# Patient Record
Sex: Female | Born: 1970 | Hispanic: Yes | Marital: Married | Smoking: Never smoker
Health system: Southern US, Community
[De-identification: ages and names within clinical notes are randomized; demographics above are authoritative.]

## PROBLEM LIST (undated history)

## (undated) DIAGNOSIS — H269 Unspecified cataract: Secondary | ICD-10-CM

## (undated) DIAGNOSIS — N920 Excessive and frequent menstruation with regular cycle: Secondary | ICD-10-CM

## (undated) DIAGNOSIS — E538 Deficiency of other specified B group vitamins: Secondary | ICD-10-CM

## (undated) DIAGNOSIS — T7840XA Allergy, unspecified, initial encounter: Secondary | ICD-10-CM

## (undated) DIAGNOSIS — J302 Other seasonal allergic rhinitis: Secondary | ICD-10-CM

## (undated) DIAGNOSIS — R519 Headache, unspecified: Secondary | ICD-10-CM

## (undated) DIAGNOSIS — D509 Iron deficiency anemia, unspecified: Secondary | ICD-10-CM

## (undated) DIAGNOSIS — R569 Unspecified convulsions: Secondary | ICD-10-CM

## (undated) DIAGNOSIS — Z5189 Encounter for other specified aftercare: Secondary | ICD-10-CM

## (undated) HISTORY — PX: BREAST BIOPSY: SHX20

## (undated) HISTORY — DX: Unspecified convulsions: R56.9

## (undated) HISTORY — DX: Unspecified cataract: H26.9

## (undated) HISTORY — PX: BREAST SURGERY: SHX581

## (undated) HISTORY — DX: Other seasonal allergic rhinitis: J30.2

## (undated) HISTORY — PX: APPENDECTOMY: SHX54

## (undated) HISTORY — DX: Allergy, unspecified, initial encounter: T78.40XA

## (undated) HISTORY — DX: Iron deficiency anemia, unspecified: D50.9

## (undated) HISTORY — PX: CATARACT EXTRACTION, BILATERAL: SHX1313

## (undated) HISTORY — PX: WISDOM TOOTH EXTRACTION: SHX21

## (undated) HISTORY — PX: CYST EXCISION: SHX5701

---
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
## 1996-05-21 HISTORY — PX: TUBAL LIGATION: SHX77

## 2016-04-27 LAB — GLUCOSE, POCT (MANUAL RESULT ENTRY): POC GLUCOSE: 88 mg/dL (ref 70–99)

## 2017-11-18 ENCOUNTER — Emergency Department (HOSPITAL_COMMUNITY)
Admission: EM | Admit: 2017-11-18 | Discharge: 2017-11-18 | Disposition: A | Discharge status: Home / Self Care | Payer: Commercial Managed Care - PPO | Attending: Emergency Medicine | Admitting: Emergency Medicine

## 2017-11-18 ENCOUNTER — Other Ambulatory Visit: Payer: Self-pay

## 2017-11-18 ENCOUNTER — Emergency Department (HOSPITAL_COMMUNITY): Payer: Commercial Managed Care - PPO

## 2017-11-18 DIAGNOSIS — M79672 Pain in left foot: Secondary | ICD-10-CM | POA: Insufficient documentation

## 2017-11-18 DIAGNOSIS — Z79899 Other long term (current) drug therapy: Secondary | ICD-10-CM | POA: Insufficient documentation

## 2017-11-18 MED ORDER — IBUPROFEN 800 MG PO TABS
800.0000 mg | ORAL_TABLET | Freq: Once | ORAL | Status: AC
  Administered 2017-11-18: 800 mg via ORAL
  Filled 2017-11-18: qty 1

## 2017-11-18 MED ORDER — IBUPROFEN 800 MG PO TABS: 800.0000 mg | ORAL_TABLET | Freq: Three times a day (TID) | ORAL | 0 refills | Status: AC | PRN

## 2017-11-18 NOTE — ED Triage Notes (Signed)
Pt reports she has a hx of plantar fascitis but this pain in her left foot feels different. PT reports a palpable bump on the side of her foot. Pt reports pain with walking and swelling.

## 2017-11-18 NOTE — Discharge Instructions (Addendum)
X-ray showed no fractures or dislocations. For pain, you may use Tylenol and/or Ibuprofen/Naproxen. Heat or cold compresses can also be used to alleviate the pain.  You may follow-up with a PCP to determine if you need to follow-up with a foot specialist given your plantar fasciitis. Below is the number for [facility] where you can establish care with a primary provider.

## 2017-11-18 NOTE — ED Provider Notes (Signed)
[facility] DEPT Provider Note  CSN: [id number] Arrival date & time: 11/18/17  1224  History   Chief Complaint Chief Complaint  Patient presents with  . Foot Pain    HPI Meghan Dawson is a 47 y.o. female with a medical history of plantar fasciitis who presented to the ED for left foot pain x3 days. Patient endorses a "bump" on the lateral aspect of foot which she first noticed upon waking up on Saturday 11/16/17. She states the bump is tender to palpation and that she is unable to bear full weight due to the pain. Denies recent injuries, traumas or accidents. Denies fever, paresthesias, foot drop, weakness or gait/coordination or balance issues.   No past medical history on file.  There are no active problems to display for this patient.    OB History   None      Home Medications    Prior to Admission medications   Medication Sig Start Date End Date Taking? Authorizing Provider  BIOTIN PO Take 3 tablets by mouth daily.   Yes [provider]  ESTROGENS CONJ SYNTHETIC B PO Take 2 tablets by mouth every morning.   Yes [provider]  Multiple Vitamins-Minerals (MULTIPLE VITAMINS/WOMENS PO) Take 2 Pieces by mouth daily.   Yes [provider]  ibuprofen (ADVIL,MOTRIN) 800 MG tablet Take 1 tablet (800 mg total) by mouth every 8 (eight) hours as needed for up to 10 days for mild pain. 11/18/17 11/28/17  Labaron Digirolamo, Jonelle Sports, PA-C    Family History No family history on file.  Social History Social History   Tobacco Use  . Smoking status: Not on file  Substance Use Topics  . Alcohol use: Not on file  . Drug use: Not on file     Allergies   Aspirin   Review of Systems Review of Systems  Constitutional: Negative for chills and fever.  Musculoskeletal: Positive for arthralgias. Negative for back pain, gait problem and joint swelling.  Skin: Negative for color change and wound.  Allergic/Immunologic: Negative.     Neurological: Negative for weakness and numbness.     Physical Exam Updated Vital Signs BP 115/88 (BP Location: Right Arm)   Pulse 89   Temp 97.9 F (36.6 C)   Resp 16   LMP 09/18/2017 (Approximate)   SpO2 99%   Physical Exam  Constitutional: She appears well-developed and well-nourished. No distress.  Cardiovascular:  Pulses:      Dorsalis pedis pulses are 2+ on the right side, and 2+ on the left side.       Posterior tibial pulses are 2+ on the right side, and 2+ on the left side.  Musculoskeletal:       Right ankle: Normal.       Left ankle: She exhibits swelling. She exhibits normal range of motion. Tenderness. Achilles tendon normal.       Right foot: Normal. There is normal range of motion and no deformity.       Left foot: There is swelling. There is normal range of motion, no tenderness and no deformity.       Feet:  5/5 strength in toes and ankles bilaterally. Lateral aspect of left foot tender to palpation.  Feet:  Right Foot:  Skin Integrity: Negative for erythema or warmth.  Left Foot:  Skin Integrity: Negative for erythema or warmth.  Neurological: She has normal strength. No sensory deficit. She exhibits normal muscle tone.  Reflex Scores:  Patellar reflexes are 2+ on the right side and 2+ on the left side.      Achilles reflexes are 2+ on the right side and 2+ on the left side. Patient able to bear weight. Pain with ambulation, especially with plantar flexion.  Skin: Skin is warm and intact. Capillary refill takes less than 2 seconds. No abrasion, no bruising and no rash noted. No erythema.  Nursing note and vitals reviewed.    ED Treatments / Results  Labs (all labs ordered are listed, but only abnormal results are displayed) Labs Reviewed - No data to display  EKG None  Radiology Dg Foot Complete Left  Result Date: 11/18/2017 CLINICAL DATA:  Left foot pain.  No known injury. EXAM: LEFT FOOT - COMPLETE 3+ VIEW COMPARISON:  None. FINDINGS:  There is no evidence of fracture or dislocation. There is no evidence of arthropathy or other focal bone abnormality. Soft tissues are unremarkable. IMPRESSION: Negative. Electronically Signed   By: Rolm Baptise M.D.   On: 11/18/2017 13:25    Procedures Procedures (including critical care time)  Medications Ordered in ED Medications  ibuprofen (ADVIL,MOTRIN) tablet 800 mg (800 mg Oral Given 11/18/17 1356)    Initial Impression / Assessment and Plan / ED Course  Triage vital signs and the nursing notes have been reviewed.  Pertinent labs & imaging results that were available during care of the patient were reviewed and considered in medical decision making (see chart for details).   Patient presents in no acute distress and was able to ambulate on her own today. Foot x-ray was reassuring as no fractures or dislocations were visualized. Patient has 5/5 strength and full active/passive ROM on physical exam. However, she endorsed significant pain with plantar flexion. Achilles tendon was normal and no erythema, ecchymosis or deformities seen in lower extremities. Lateral aspect of foot tender to palpation. 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 Given location of pain, poor footwear may also be contributing. No s/s consistent with infectious or rheum etiology that would require further evaluation today.  Final Clinical Impressions(s) / ED Diagnoses  1. Left Foot Pain. MSK etiology. Likely 2/2 to overuse. CAM walker applied in ED. Education provided on OTC and supportive treatment for pain relief. Advised to follow-up with PCP who may determine if referral to podiatrist is necessary.  Dispo: Home. After thorough clinical evaluation, this patient is determined to be medically stable and can be safely discharged with the previously mentioned treatment and/or outpatient follow-up/referral(s). At this time, there are no other  apparent medical conditions that require further screening, evaluation or treatment.   Final diagnoses:  Foot pain, left    ED Discharge Orders        Ordered    ibuprofen (ADVIL,MOTRIN) 800 MG tablet  Every 8 hours PRN     11/18/17 1410        Shaley Leavens, [place] I, PA-C 11/18/17 1446    Dorie Rank, MD 11/19/17 [phone]

## 2017-12-13 ENCOUNTER — Ambulatory Visit (INDEPENDENT_AMBULATORY_CARE_PROVIDER_SITE_OTHER): Payer: Commercial Managed Care - PPO | Admitting: Family Medicine

## 2017-12-13 ENCOUNTER — Encounter: Payer: Self-pay | Admitting: Family Medicine

## 2017-12-13 VITALS — BP 108/80 | HR 87 | Temp 98.1°F | Ht 69.0 in | Wt 172.4 lb

## 2017-12-13 DIAGNOSIS — D509 Iron deficiency anemia, unspecified: Secondary | ICD-10-CM | POA: Insufficient documentation

## 2017-12-13 DIAGNOSIS — Z0000 Encounter for general adult medical examination without abnormal findings: Secondary | ICD-10-CM | POA: Diagnosis not present

## 2017-12-13 DIAGNOSIS — Z13220 Encounter for screening for lipoid disorders: Secondary | ICD-10-CM

## 2017-12-13 NOTE — Progress Notes (Signed)
Meghan Dawson - 47 y.o. female MRN [id number]  Date of birth: 04-14-1971  Subjective Chief Complaint  Patient presents with  . Establish Care    est care, has anemia.    HPI Meghan Dawson is a 47 y.o. female with history of iron deficiency anemia here today to establish care and annual CPE.  She was born in [place] and recently moved to [place] from [place] with her husband.  She is adjusting well since the move and is currently working as a Quarry manager.  She report history of anemia requiring tranfusion in the past and tells me that she has started to feel a little more fatigued, similar to what she felt when having anemia previously.  She reports being up to date on PAP and mammogram.   Review of Systems  Constitutional: Positive for malaise/fatigue. Negative for chills, fever and weight loss.  HENT: Negative for congestion, ear pain and sore throat.   Eyes: Negative for blurred vision, double vision and pain.  Respiratory: Negative for cough and shortness of breath.   Cardiovascular: Negative for chest pain and palpitations.  Gastrointestinal: Negative for abdominal pain, blood in stool, constipation, heartburn and nausea.  Genitourinary: Negative for dysuria and urgency.  Musculoskeletal: Negative for joint pain and myalgias.  Neurological: Negative for dizziness and headaches.  Endo/Heme/Allergies: Does not bruise/bleed easily.  Psychiatric/Behavioral: Negative for depression. The patient is not nervous/anxious and does not have insomnia.     Allergies  Allergen Reactions  . Aspirin Hives and Swelling    Past Medical History:  Diagnosis Date  . Allergy   . Blood transfusion without reported diagnosis   . Iron deficiency anemia     Past Surgical History:  Procedure Laterality Date  . APPENDECTOMY      Social History   Socioeconomic History  . Marital status: Married    Spouse name: Not on file  . Number of children: Not on file  . Years of education: Not on file  . Highest  education level: Not on file  Occupational History  . Not on file  Social Needs  . Financial resource strain: Not on file  . Food insecurity:    Worry: Not on file    Inability: Not on file  . Transportation needs:    Medical: Not on file    Non-medical: Not on file  Tobacco Use  . Smoking status: Never Smoker  . Smokeless tobacco: Never Used  Substance and Sexual Activity  . Alcohol use: Not on file  . Drug use: Not on file  . Sexual activity: Not on file  Lifestyle  . Physical activity:    Days per week: Not on file    Minutes per session: Not on file  . Stress: Not on file  Relationships  . Social connections:    Talks on phone: Not on file    Gets together: Not on file    Attends religious service: Not on file    Active member of club or organization: Not on file    Attends meetings of clubs or organizations: Not on file    Relationship status: Not on file  Other Topics Concern  . Not on file  Social History Narrative  . Not on file    Family History  Problem Relation Age of Onset  . Hypertension Mother   . Miscarriages / [place] Mother   . Hyperlipidemia Mother   . Hypertension Father   . Heart disease Father   .  Diabetes Father   . Hypertension Sister   . Miscarriages / Stillbirths Sister   . Diabetes Sister   . Hypertension Brother   . Hypertension Maternal Grandmother   . Hypertension Maternal Grandfather     Health Maintenance  Topic Date Due  . HIV Screening  11/17/1986  . TETANUS/TDAP  11/17/1990  . PAP SMEAR  11/16/1992  . INFLUENZA VACCINE  12/19/2017    ----------------------------------------------------------------------------------------------------------------------------------------------------------------------------------------------------------------- Physical Exam BP 108/80 (BP Location: Left Arm, Patient Position: Sitting, Cuff Size: Normal)   Pulse 87   Temp 98.1 F (36.7 C) (Oral)   Ht '5\' 9"'$  (1.753 m)   Wt 172 lb 6.4 oz  (78.2 kg)   LMP 09/18/2017 (Approximate)   SpO2 99%   BMI 25.46 kg/m   Physical Exam  Constitutional: She is oriented to person, place, and time. She appears well-nourished. No distress.  HENT:  Head: Normocephalic and atraumatic.  Nose: Nose normal.  Mouth/Throat: Oropharynx is clear and moist.  Eyes: Conjunctivae are normal. No scleral icterus.  Neck: Normal range of motion. Neck supple. No thyromegaly present.  Cardiovascular: Normal rate, regular rhythm and normal heart sounds.  Pulmonary/Chest: Effort normal and breath sounds normal.  Abdominal: Soft. Bowel sounds are normal. She exhibits no distension. There is no tenderness. There is no guarding.  Musculoskeletal: Normal range of motion. She exhibits no edema.  Lymphadenopathy:    She has no cervical adenopathy.  Neurological: She is alert and oriented to person, place, and time. No cranial nerve deficit. Coordination normal.  Skin: Skin is warm and dry. No rash noted.  Psychiatric: She has a normal mood and affect. Her behavior is normal.    ------------------------------------------------------------------------------------------------------------------------------------------------------------------------------------------------------------------- Assessment and Plan  Iron deficiency anemia Check CBC and ferritin  Well adult exam Well adult Orders Placed This Encounter  Procedures  . Comp Met (CMET)  . CBC  . Lipid Profile  . TSH  . Ferritin  Immunizations:  She believes she is up to date on recommended immunizations Screenings: reports she is up to date on PAP and mammo, outside records requested Anticipatory guidance/Risk factor reduction:  Per AVS.

## 2017-12-13 NOTE — Assessment & Plan Note (Signed)
Check CBC and ferritin 

## 2017-12-13 NOTE — Patient Instructions (Signed)

## 2017-12-13 NOTE — Assessment & Plan Note (Signed)
Well adult Orders Placed This Encounter  Procedures  . Comp Met (CMET)  . CBC  . Lipid Profile  . TSH  . Ferritin  Immunizations:  She believes she is up to date on recommended immunizations Screenings: reports she is up to date on PAP and mammo, outside records requested Anticipatory guidance/Risk factor reduction:  Per AVS.

## 2017-12-14 LAB — COMPREHENSIVE METABOLIC PANEL
AG Ratio: 1.5 (calc) (ref 1.0–2.5)
ALT: 17 U/L (ref 6–29)
AST: 18 U/L (ref 10–35)
Albumin: 4.4 g/dL (ref 3.6–5.1)
Alkaline phosphatase (APISO): 59 U/L (ref 33–115)
BUN: 13 mg/dL (ref 7–25)
CO2: 25 mmol/L (ref 20–32)
Calcium: 9.5 mg/dL (ref 8.6–10.2)
Chloride: 107 mmol/L (ref 98–110)
Creat: 0.71 mg/dL (ref 0.50–1.10)
Globulin: 2.9 g/dL (calc) (ref 1.9–3.7)
Glucose, Bld: 93 mg/dL (ref 65–99)
Potassium: 4.5 mmol/L (ref 3.5–5.3)
Sodium: 141 mmol/L (ref 135–146)
Total Bilirubin: 0.3 mg/dL (ref 0.2–1.2)
Total Protein: 7.3 g/dL (ref 6.1–8.1)

## 2017-12-14 LAB — LIPID PANEL
Cholesterol: 197 mg/dL (ref <200)
HDL: 59 mg/dL (ref >50)
LDL Cholesterol (Calc): 119 mg/dL (calc) — ABNORMAL HIGH
Non-HDL Cholesterol (Calc): 138 mg/dL (calc) — ABNORMAL HIGH (ref <130)
Total CHOL/HDL Ratio: 3.3 (calc) (ref <5.0)
Triglycerides: 86 mg/dL (ref <150)

## 2017-12-14 LAB — CBC
HCT: 35.1 % (ref 35.0–45.0)
Hemoglobin: 11.5 g/dL — ABNORMAL LOW (ref 11.7–15.5)
MCH: 24.5 pg — ABNORMAL LOW (ref 27.0–33.0)
MCHC: 32.8 g/dL (ref 32.0–36.0)
MCV: 74.8 fL — ABNORMAL LOW (ref 80.0–100.0)
MPV: 9.9 fL (ref 7.5–12.5)
Platelets: 338 10*3/uL (ref 140–400)
RBC: 4.69 10*6/uL (ref 3.80–5.10)
RDW: 15.9 % — ABNORMAL HIGH (ref 11.0–15.0)
WBC: 6.4 10*3/uL (ref 3.8–10.8)

## 2017-12-14 LAB — TSH: TSH: 1.69 mIU/L

## 2017-12-14 LAB — FERRITIN: Ferritin: 6 ng/mL — ABNORMAL LOW (ref 16–232)

## 2017-12-17 NOTE — Progress Notes (Signed)
-  She has mild anemia and iron stores are quite low.  I would recommend starting iron (ferrous sulfate 325mg ) supplement daily.   -Cholesterol is mildly elevated, no need for medication at this time.  Follow low fat diet with regular exercise to improve this.

## 2018-02-28 ENCOUNTER — Ambulatory Visit: Payer: Commercial Managed Care - PPO | Admitting: Family Medicine

## 2018-03-07 ENCOUNTER — Encounter: Payer: Self-pay | Admitting: Family Medicine

## 2018-03-07 ENCOUNTER — Ambulatory Visit (INDEPENDENT_AMBULATORY_CARE_PROVIDER_SITE_OTHER): Payer: Commercial Managed Care - PPO | Admitting: Family Medicine

## 2018-03-07 VITALS — BP 120/80 | HR 80 | Temp 98.0°F | Wt 175.2 lb

## 2018-03-07 DIAGNOSIS — D508 Other iron deficiency anemias: Secondary | ICD-10-CM

## 2018-03-07 DIAGNOSIS — Z23 Encounter for immunization: Secondary | ICD-10-CM

## 2018-03-07 LAB — CBC
HCT: 34.7 % — ABNORMAL LOW (ref 35.0–45.0)
Hemoglobin: 11.7 g/dL (ref 11.7–15.5)
MCH: 24 pg — ABNORMAL LOW (ref 27.0–33.0)
MCHC: 33.7 g/dL (ref 32.0–36.0)
MCV: 71.3 fL — ABNORMAL LOW (ref 80.0–100.0)
MPV: 10 fL (ref 7.5–12.5)
Platelets: 312 10*3/uL (ref 140–400)
RBC: 4.87 10*6/uL (ref 3.80–5.10)
RDW: 16.5 % — ABNORMAL HIGH (ref 11.0–15.0)
WBC: 6.6 10*3/uL (ref 3.8–10.8)

## 2018-03-07 LAB — FERRITIN: Ferritin: 8 ng/mL — ABNORMAL LOW (ref 16–232)

## 2018-03-07 NOTE — Progress Notes (Signed)
Meghan Dawson - 47 y.o. female MRN [id number]  Date of birth: 05/30/1970  Subjective Chief Complaint  Patient presents with  . Follow-up    HPI Meghan Dawson is a 47 y.o. female  here today for follow up of of anemia.  Previously complaining of mild fatigue.  Hgb of 11.5 with ferritin of 6.  Has been taking iron, took for two months.   Fatigue has improved some.   ROS:  A comprehensive ROS was completed and negative except as noted per HPI  Allergies  Allergen Reactions  . Aspirin Hives and Swelling    Past Medical History:  Diagnosis Date  . Allergy   . Blood transfusion without reported diagnosis   . Iron deficiency anemia     Past Surgical History:  Procedure Laterality Date  . APPENDECTOMY      Social History   Socioeconomic History  . Marital status: Married    Spouse name: Not on file  . Number of children: Not on file  . Years of education: Not on file  . Highest education level: Not on file  Occupational History  . Not on file  Social Needs  . Financial resource strain: Not on file  . Food insecurity:    Worry: Not on file    Inability: Not on file  . Transportation needs:    Medical: Not on file    Non-medical: Not on file  Tobacco Use  . Smoking status: Never Smoker  . Smokeless tobacco: Never Used  Substance and Sexual Activity  . Alcohol use: Yes  . Drug use: Never  . Sexual activity: Yes  Lifestyle  . Physical activity:    Days per week: Not on file    Minutes per session: Not on file  . Stress: Not on file  Relationships  . Social connections:    Talks on phone: Not on file    Gets together: Not on file    Attends religious service: Not on file    Active member of club or organization: Not on file    Attends meetings of clubs or organizations: Not on file    Relationship status: Not on file  Other Topics Concern  . Not on file  Social History Narrative  . Not on file    Family History  Problem Relation Age of Onset  .  Hypertension Mother   . Miscarriages / [place] Mother   . Hyperlipidemia Mother   . Hypertension Father   . Heart disease Father   . Diabetes Father   . Hypertension Sister   . Miscarriages / Stillbirths Sister   . Diabetes Sister   . Hypertension Brother   . Hypertension Maternal Grandmother   . Hypertension Maternal Grandfather     Health Maintenance  Topic Date Due  . HIV Screening  11/17/1986  . TETANUS/TDAP  11/17/1990  . PAP SMEAR  11/16/1992  . INFLUENZA VACCINE  12/19/2017    ----------------------------------------------------------------------------------------------------------------------------------------------------------------------------------------------------------------- Physical Exam BP 120/80   Pulse 80   Temp 98 F (36.7 C) (Oral)   Wt 175 lb 3.2 oz (79.5 kg)   LMP 09/18/2017 (Approximate)   SpO2 98%   BMI 25.87 kg/m   Physical Exam  Constitutional: She is oriented to person, place, and time. She appears well-nourished. No distress.  Eyes:  No conjunctival pallor noted.   Cardiovascular: Normal rate and regular rhythm.  Pulmonary/Chest: Effort normal and breath sounds normal.  Neurological: She is alert and oriented to person, place, and time.  Skin: Skin is warm and dry.  Psychiatric: She has a normal mood and affect. Her behavior is normal.    ------------------------------------------------------------------------------------------------------------------------------------------------------------------------------------------------------------------- Assessment and Plan  Iron deficiency anemia -Improved symptoms.  -Update ferritin and cbc today.   Orders Placed This Encounter  Procedures  . Flu Vaccine QUAD 6+ mos PF IM (Fluarix Quad PF)  . Ferritin  . CBC

## 2018-03-07 NOTE — Assessment & Plan Note (Signed)
-  Improved symptoms.  -Update ferritin and cbc today.

## 2018-03-07 NOTE — Patient Instructions (Signed)
We'll be in touch with lab results.  

## 2018-03-11 NOTE — Progress Notes (Signed)
Iron stores remain low, anemia has improved some.  I would recommend continuing to take an iron supplement.

## 2018-05-21 DIAGNOSIS — I447 Left bundle-branch block, unspecified: Secondary | ICD-10-CM

## 2018-05-21 HISTORY — DX: Left bundle-branch block, unspecified: I44.7

## 2018-07-25 ENCOUNTER — Encounter: Payer: Self-pay | Admitting: Family Medicine

## 2018-07-25 ENCOUNTER — Encounter: Payer: Commercial Managed Care - PPO | Admitting: Family Medicine

## 2018-07-25 ENCOUNTER — Ambulatory Visit (INDEPENDENT_AMBULATORY_CARE_PROVIDER_SITE_OTHER): Payer: Commercial Managed Care - PPO | Admitting: Family Medicine

## 2018-07-25 VITALS — BP 124/80 | HR 89 | Temp 97.9°F | Resp 18 | Ht 69.0 in | Wt 172.6 lb

## 2018-07-25 DIAGNOSIS — Z111 Encounter for screening for respiratory tuberculosis: Secondary | ICD-10-CM | POA: Insufficient documentation

## 2018-07-25 DIAGNOSIS — D508 Other iron deficiency anemias: Secondary | ICD-10-CM

## 2018-07-25 NOTE — Assessment & Plan Note (Signed)
-  Continues on PO iron supplement, tolerating well.  -Update cbc and ferritin.

## 2018-07-25 NOTE — Assessment & Plan Note (Signed)
-  quantiferon gold ordered.

## 2018-07-25 NOTE — Progress Notes (Signed)
Meghan Dawson - 48 y.o. female MRN [id number]  Date of birth: 1971/03/14  Subjective Chief Complaint  Patient presents with  . PPD Placement    for job  . lab work    pt would like to discuss posisbly getting A1C    HPI Meghan Dawson is a 48 y.o. female here today for follow up of iron deficiency anemia.  Previous ferritin of 8.  She has been taking iron supplement daily.  Denies side effects from this including significant constipation.  She denies increased fatigue or leg cramps/RLS type symptoms.   She also needs TB screening for work.    ROS:  A comprehensive ROS was completed and negative except as noted per HPI  Allergies  Allergen Reactions  . Aspirin Hives and Swelling    Past Medical History:  Diagnosis Date  . Allergy   . Blood transfusion without reported diagnosis   . Iron deficiency anemia     Past Surgical History:  Procedure Laterality Date  . APPENDECTOMY      Social History   Socioeconomic History  . Marital status: Married    Spouse name: Not on file  . Number of children: Not on file  . Years of education: Not on file  . Highest education level: Not on file  Occupational History  . Not on file  Social Needs  . Financial resource strain: Not on file  . Food insecurity:    Worry: Not on file    Inability: Not on file  . Transportation needs:    Medical: Not on file    Non-medical: Not on file  Tobacco Use  . Smoking status: Never Smoker  . Smokeless tobacco: Never Used  Substance and Sexual Activity  . Alcohol use: Yes  . Drug use: Never  . Sexual activity: Yes  Lifestyle  . Physical activity:    Days per week: Not on file    Minutes per session: Not on file  . Stress: Not on file  Relationships  . Social connections:    Talks on phone: Not on file    Gets together: Not on file    Attends religious service: Not on file    Active member of club or organization: Not on file    Attends meetings of clubs or organizations: Not on file     Relationship status: Not on file  Other Topics Concern  . Not on file  Social History Narrative  . Not on file    Family History  Problem Relation Age of Onset  . Hypertension Mother   . Miscarriages / [place] Mother   . Hyperlipidemia Mother   . Hypertension Father   . Heart disease Father   . Diabetes Father   . Hypertension Sister   . Miscarriages / Stillbirths Sister   . Diabetes Sister   . Hypertension Brother   . Hypertension Maternal Grandmother   . Hypertension Maternal Grandfather     Health Maintenance  Topic Date Due  . HIV Screening  11/17/1986  . TETANUS/TDAP  11/17/1990  . PAP SMEAR-Modifier  11/16/1992  . INFLUENZA VACCINE  Completed    ----------------------------------------------------------------------------------------------------------------------------------------------------------------------------------------------------------------- Physical Exam BP 124/80 (BP Location: Right Arm, Patient Position: Sitting, Cuff Size: Normal)   Pulse 89   Temp 97.9 F (36.6 C) (Oral)   Resp 18   Ht 5\' 9"  (1.753 m)   Wt 172 lb 9.6 oz (78.3 kg)   LMP 09/18/2017 (Approximate)   SpO2 100%   BMI 25.49  kg/m   Physical Exam Constitutional:      Appearance: Normal appearance.  HENT:     Head: Normocephalic and atraumatic.  Eyes:     General: No scleral icterus. Neck:     Musculoskeletal: Neck supple.  Cardiovascular:     Rate and Rhythm: Normal rate and regular rhythm.  Pulmonary:     Effort: Pulmonary effort is normal.     Breath sounds: Normal breath sounds.  Lymphadenopathy:     Cervical: No cervical adenopathy.  Skin:    General: Skin is warm and dry.     Findings: No rash.  Neurological:     General: No focal deficit present.     Mental Status: She is alert.  Psychiatric:        Mood and Affect: Mood normal.        Behavior: Behavior normal.      ------------------------------------------------------------------------------------------------------------------------------------------------------------------------------------------------------------------- Assessment and Plan  Iron deficiency anemia -Continues on PO iron supplement, tolerating well.  -Update cbc and ferritin.   Screening for tuberculosis -quantiferon gold ordered.

## 2018-07-25 NOTE — Patient Instructions (Signed)
-  We'll be in touch with lab results and recommendations.  -Your next physical will be due in July.

## 2018-07-27 LAB — IRON,TIBC AND FERRITIN PANEL
%SAT: 6 % (calc) — ABNORMAL LOW (ref 16–45)
Ferritin: 5 ng/mL — ABNORMAL LOW (ref 16–232)
Iron: 25 ug/dL — ABNORMAL LOW (ref 40–190)
TIBC: 452 mcg/dL (calc) — ABNORMAL HIGH (ref 250–450)

## 2018-07-27 LAB — CBC
HCT: 33.9 % — ABNORMAL LOW (ref 35.0–45.0)
Hemoglobin: 11.2 g/dL — ABNORMAL LOW (ref 11.7–15.5)
MCH: 24.2 pg — ABNORMAL LOW (ref 27.0–33.0)
MCHC: 33 g/dL (ref 32.0–36.0)
MCV: 73.4 fL — ABNORMAL LOW (ref 80.0–100.0)
MPV: 10.2 fL (ref 7.5–12.5)
Platelets: 318 10*3/uL (ref 140–400)
RBC: 4.62 10*6/uL (ref 3.80–5.10)
RDW: 16.2 % — ABNORMAL HIGH (ref 11.0–15.0)
WBC: 5.4 10*3/uL (ref 3.8–10.8)

## 2018-07-27 LAB — QUANTIFERON-TB GOLD PLUS
Mitogen-NIL: 8.42 IU/mL
NIL: 0.03 IU/mL
QuantiFERON-TB Gold Plus: NEGATIVE
TB1-NIL: 0.03 IU/mL
TB2-NIL: 0.02 IU/mL

## 2018-07-28 ENCOUNTER — Encounter: Payer: Commercial Managed Care - PPO | Admitting: Family Medicine

## 2018-07-28 ENCOUNTER — Telehealth: Payer: Self-pay

## 2018-07-28 NOTE — Progress Notes (Signed)
-  Iron levels remain low with continued anemia.  Recommend continue daily iron supplement with citrus/vitamin c to increase absorption.  -Quantiferon test is negative

## 2018-07-28 NOTE — Telephone Encounter (Signed)
Please see result note.  Thanks.

## 2018-07-28 NOTE — Telephone Encounter (Signed)
Copied from [place] [id number]. Topic: General - Other >> Jul 28, 2018  1:25 PM Mcneil, Ja-Kwan wrote: Reason for CRM: Pt requests that her lab results be mailed to her home.

## 2018-07-29 NOTE — Telephone Encounter (Signed)
Pt notified nothing further is needed

## 2018-12-02 ENCOUNTER — Other Ambulatory Visit: Payer: Self-pay

## 2018-12-03 ENCOUNTER — Other Ambulatory Visit: Payer: Self-pay | Admitting: Family Medicine

## 2018-12-03 ENCOUNTER — Other Ambulatory Visit (INDEPENDENT_AMBULATORY_CARE_PROVIDER_SITE_OTHER): Payer: Commercial Managed Care - PPO

## 2018-12-03 DIAGNOSIS — D509 Iron deficiency anemia, unspecified: Secondary | ICD-10-CM

## 2018-12-03 NOTE — Addendum Note (Signed)
Addended by: Lynnea Ferrier on: 12/03/2018 02:04 PM   Modules accepted: Orders

## 2018-12-04 LAB — CBC
Hemoglobin: 12.5 g/dL (ref 11.7–15.5)
Platelets: 401 10*3/uL — ABNORMAL HIGH (ref 140–400)
RBC: 4.88 10*6/uL (ref 3.80–5.10)
RDW: 15.8 % — ABNORMAL HIGH (ref 11.0–15.0)
WBC: 5.3 10*3/uL (ref 3.8–10.8)

## 2018-12-04 LAB — IRON,TIBC AND FERRITIN PANEL
%SAT: 6 % (calc) — ABNORMAL LOW (ref 16–45)
Ferritin: 7 ng/mL — ABNORMAL LOW (ref 16–232)
Iron: 28 ug/dL — ABNORMAL LOW (ref 40–190)

## 2019-02-16 ENCOUNTER — Telehealth: Payer: Self-pay

## 2019-02-16 DIAGNOSIS — D509 Iron deficiency anemia, unspecified: Secondary | ICD-10-CM

## 2019-02-16 NOTE — Telephone Encounter (Signed)
Please order cbc/ferritin and schedule for lab visit.  Thanks!

## 2019-02-16 NOTE — Telephone Encounter (Signed)
Copied from [place]. Topic: General - Other >> Feb 13, 2019  1:45 PM Celene Kras A wrote: Reason for CRM: Pt called and is requesting to have her hemoglobin checked again.  Pt is requesting to have the orders put in. Please advise.

## 2019-02-16 NOTE — Addendum Note (Signed)
Addended by: Rodrigo Ran on: 02/16/2019 01:25 PM   Modules accepted: Orders

## 2019-02-16 NOTE — Telephone Encounter (Signed)
lmovm for pt to call the office back to schedule a lab appointment. Lab orders entered.

## 2019-02-24 ENCOUNTER — Ambulatory Visit: Payer: Commercial Managed Care - PPO

## 2019-03-02 ENCOUNTER — Other Ambulatory Visit: Payer: Self-pay

## 2019-03-03 ENCOUNTER — Encounter: Payer: Self-pay | Admitting: Family Medicine

## 2019-03-03 ENCOUNTER — Other Ambulatory Visit (INDEPENDENT_AMBULATORY_CARE_PROVIDER_SITE_OTHER): Payer: Commercial Managed Care - PPO

## 2019-03-03 ENCOUNTER — Ambulatory Visit (INDEPENDENT_AMBULATORY_CARE_PROVIDER_SITE_OTHER): Payer: Commercial Managed Care - PPO

## 2019-03-03 DIAGNOSIS — D509 Iron deficiency anemia, unspecified: Secondary | ICD-10-CM

## 2019-03-03 DIAGNOSIS — Z23 Encounter for immunization: Secondary | ICD-10-CM

## 2019-03-03 LAB — CBC
HCT: 36.3 % (ref 36.0–46.0)
Hemoglobin: 11.8 g/dL — ABNORMAL LOW (ref 12.0–15.0)
MCHC: 32.6 g/dL (ref 30.0–36.0)
MCV: 76.6 fl — ABNORMAL LOW (ref 78.0–100.0)
Platelets: 345 10*3/uL (ref 150.0–400.0)
RBC: 4.74 Mil/uL (ref 3.87–5.11)

## 2019-03-03 NOTE — Progress Notes (Signed)
Pt came into the office for her flu vaccine. Vaccine given in the right deltoid. Pt tolerated injection well, no signs/symptoms of a reaction prior to leaving the exam room.

## 2019-03-04 LAB — IRON,TIBC AND FERRITIN PANEL
%SAT: 6 % (calc) — ABNORMAL LOW (ref 16–45)
Ferritin: 5 ng/mL — ABNORMAL LOW (ref 16–232)
Iron: 24 ug/dL — ABNORMAL LOW (ref 40–190)
TIBC: 436 mcg/dL (calc) (ref 250–450)

## 2019-03-17 NOTE — Progress Notes (Signed)
Iron levels remain low, recommend that she continue iron supplement daily

## 2019-03-22 DIAGNOSIS — Z5189 Encounter for other specified aftercare: Secondary | ICD-10-CM

## 2019-03-22 HISTORY — DX: Encounter for other specified aftercare: Z51.89

## 2019-03-31 ENCOUNTER — Telehealth: Payer: Self-pay

## 2019-03-31 ENCOUNTER — Other Ambulatory Visit: Payer: Self-pay

## 2019-03-31 DIAGNOSIS — D509 Iron deficiency anemia, unspecified: Secondary | ICD-10-CM

## 2019-03-31 NOTE — Telephone Encounter (Signed)
Can refer to hematology to discuss iron infusion.

## 2019-03-31 NOTE — Telephone Encounter (Signed)
Copied from [place] [id number]. Topic: General - Other >> Mar 31, 2019 11:37 AM Keene Breath wrote: Reason for CRM: Patient called to ask if the doctor or nurse has a different alternative for her low iron.  Patient that the iron pills that she has been taking is not improving her condition.  Please advise and call to discuss at [phone]

## 2019-04-27 ENCOUNTER — Telehealth: Payer: Self-pay | Admitting: Hematology and Oncology

## 2019-04-27 NOTE — Telephone Encounter (Signed)
Ms. Meghan Dawson returned my call to schedule a new hem appt for [name]. She's been scheduled to see Dr. Lorenso Courier on 12/18 at 2pm. Aware to arrive 15 minutes early.

## 2019-04-29 ENCOUNTER — Emergency Department (HOSPITAL_COMMUNITY)
Admission: EM | Admit: 2019-04-29 | Discharge: 2019-04-29 | Disposition: A | Discharge status: Home / Self Care | Payer: Commercial Managed Care - PPO | Attending: Emergency Medicine | Admitting: Emergency Medicine

## 2019-04-29 ENCOUNTER — Ambulatory Visit: Payer: Self-pay | Admitting: *Deleted

## 2019-04-29 ENCOUNTER — Other Ambulatory Visit: Payer: Self-pay

## 2019-04-29 ENCOUNTER — Encounter (HOSPITAL_COMMUNITY): Payer: Self-pay

## 2019-04-29 DIAGNOSIS — Z20828 Contact with and (suspected) exposure to other viral communicable diseases: Secondary | ICD-10-CM | POA: Insufficient documentation

## 2019-04-29 DIAGNOSIS — Z3202 Encounter for pregnancy test, result negative: Secondary | ICD-10-CM | POA: Diagnosis not present

## 2019-04-29 DIAGNOSIS — Z79899 Other long term (current) drug therapy: Secondary | ICD-10-CM | POA: Diagnosis not present

## 2019-04-29 DIAGNOSIS — R531 Weakness: Secondary | ICD-10-CM | POA: Diagnosis not present

## 2019-04-29 DIAGNOSIS — R6889 Other general symptoms and signs: Secondary | ICD-10-CM

## 2019-04-29 LAB — URINALYSIS, ROUTINE W REFLEX MICROSCOPIC
Bilirubin Urine: NEGATIVE
Glucose, UA: NEGATIVE mg/dL
Hgb urine dipstick: NEGATIVE
Ketones, ur: NEGATIVE mg/dL
Leukocytes,Ua: NEGATIVE
Nitrite: NEGATIVE
Protein, ur: NEGATIVE mg/dL
Specific Gravity, Urine: 1.014 (ref 1.005–1.030)
pH: 6 (ref 5.0–8.0)

## 2019-04-29 LAB — SAMPLE TO BLOOD BANK

## 2019-04-29 LAB — COMPREHENSIVE METABOLIC PANEL
Albumin: 4.3 g/dL (ref 3.5–5.0)
BUN: 13 mg/dL (ref 6–20)
Calcium: 9.3 mg/dL (ref 8.9–10.3)
Chloride: 104 mmol/L (ref 98–111)
GFR calc non Af Amer: >60 mL/min (ref >60)
Glucose, Bld: 86 mg/dL (ref 70–99)
Total Bilirubin: 0.5 mg/dL (ref 0.3–1.2)
Total Protein: 7.7 g/dL (ref 6.5–8.1)

## 2019-04-29 LAB — TROPONIN I (HIGH SENSITIVITY): Troponin I (High Sensitivity): <2 ng/L (ref <18)

## 2019-04-29 LAB — CBC WITH DIFFERENTIAL/PLATELET
Abs Immature Granulocytes: 0.01 10*3/uL (ref 0.00–0.07)
Eosinophils Relative: 2 %
HCT: 36.2 % (ref 36.0–46.0)
Immature Granulocytes: 0 %
Lymphocytes Relative: 36 %
Lymphs Abs: 2.5 10*3/uL (ref 0.7–4.0)
MCHC: 31.8 g/dL (ref 30.0–36.0)
MCV: 75.7 fL — ABNORMAL LOW (ref 80.0–100.0)
Neutro Abs: 3.7 10*3/uL (ref 1.7–7.7)
Neutrophils Relative %: 53 %
Platelets: 363 10*3/uL (ref 150–400)
RBC: 4.78 MIL/uL (ref 3.87–5.11)
RDW: 17.2 % — ABNORMAL HIGH (ref 11.5–15.5)
WBC: 7 10*3/uL (ref 4.0–10.5)

## 2019-04-29 LAB — PREGNANCY, URINE: Preg Test, Ur: NEGATIVE

## 2019-04-29 MED ORDER — LORAZEPAM 2 MG/ML IJ SOLN
0.5000 mg | Freq: Once | INTRAMUSCULAR | Status: AC
  Administered 2019-04-29: 0.5 mg via INTRAVENOUS
  Filled 2019-04-29: qty 1

## 2019-04-29 MED ORDER — SODIUM CHLORIDE 0.9 % IV BOLUS
1000.0000 mL | Freq: Once | INTRAVENOUS | Status: AC
  Administered 2019-04-29: 1000 mL via INTRAVENOUS

## 2019-04-29 NOTE — Telephone Encounter (Signed)
Pt called with having dizziness, blurred vision. Stated hands are cold and clammy, nail beds pale. She sounds weak. Denies having shortness of breath.  Sees a hematologist and has a history of anemia.  She is at work now.  Per protocol, pt should be seen in the ED. She is going (someone will take her) to the hospital. Routing to the [facility] at [facility]  for review.  Reason for Disposition . Nursing judgment or information in reference  Answer Assessment - Initial Assessment Questions 1. REASON FOR CALL: "What is your main concern right now?"     Having symptoms of a low blood count 2. ONSET: "When did the symptoms start?"     today 3. SEVERITY: "How bad are the symptoms?"     Not able to work 4. FEVER: "Do you have a fever?"     no 5. OTHER SYMPTOMS: "Do you have any other new symptoms?"     Dizziness, weak, blurred vision, cold and clammy hands 6. INTERVENTIONS AND RESPONSE: "What have you done so far to try to make this better? What medications have you used?"      7. PREGNANCY: "Is there any chance you are pregnant?"  Protocols used: NO GUIDELINE AVAILABLE-A-AH

## 2019-04-29 NOTE — Discharge Instructions (Addendum)
Today your hemoglobin was minimally low.  Please keep your outpatient follow-up appointments. Your blood work and labs were reassuring. You may not return to work if you are having symptoms.

## 2019-04-29 NOTE — ED Triage Notes (Signed)
Pt states she has a history of anemia and thinks that her blood levels are low. Pt states that she is having her typical symptoms of weakness, pale, blurred vision, dizziness. Pt is A&Ox4 at this time.

## 2019-04-29 NOTE — ED Provider Notes (Signed)
[place] DEPT Provider Note   CSN: [id number] Arrival date & time: 04/29/19  1146     History   Chief Complaint Chief Complaint  Patient presents with  . Anemia    HPI Meghan Dawson is a 48 y.o. female with a past medical history of iron deficiency anemia requiring reportedly 2 blood transfusions a year, who presents today for evaluation of feeling unwell.  She reports that recently she has been feeling increasingly weak (body wide) and tired.  She denies any pain.  She reports that she was at work earlier today when she started feeling weak, like she was going to pass out with blurry vision.  She reports that she ate breakfast and does not take any prescription medications.  She denies any stressful event prior to onset of her symptoms.  She reports feeling generally shaky.  She states that currently she feels like her vision is slightly off, however improves once she puts her glasses on.    No known sick contacts.  No fevers.  No vomiting, abdominal or chest pain.    She does not take any hormones.  She does not have a history of PE/DVT.  No hemoptysis, leg swelling, or shortness of breath.     HPI  Past Medical History:  Diagnosis Date  . Allergy   . Blood transfusion without reported diagnosis   . Iron deficiency anemia     Patient Active Problem List   Diagnosis Date Noted  . Screening for tuberculosis 07/25/2018  . Well adult exam 12/13/2017  . Iron deficiency anemia 12/13/2017    Past Surgical History:  Procedure Laterality Date  . APPENDECTOMY       OB History   No obstetric history on file.      Home Medications    Prior to Admission medications   Medication Sig Start Date End Date Taking? Authorizing Provider  BIOTIN PO Take 3 tablets by mouth daily.   Yes [provider]  ESTROGENS CONJ SYNTHETIC B PO Take 2 tablets by mouth every morning.   Yes [provider]  ferrous sulfate 325 (65 FE) MG  tablet Take 325 mg by mouth daily with breakfast.   Yes [provider]  Multiple Vitamins-Minerals (MULTIPLE VITAMINS/WOMENS PO) Take 2 Pieces by mouth daily.   Yes [provider]  Omega-3 Fatty Acids (FISH OIL) 1000 MG CPDR Take 1,000 mg by mouth daily.   Yes [provider]  vitamin C (ASCORBIC ACID) 250 MG tablet Take 250 mg by mouth daily.   Yes [provider]    Family History Family History  Problem Relation Age of Onset  . Hypertension Mother   . Miscarriages / [place] Mother   . Hyperlipidemia Mother   . Hypertension Father   . Heart disease Father   . Diabetes Father   . Hypertension Sister   . Miscarriages / Stillbirths Sister   . Diabetes Sister   . Hypertension Brother   . Hypertension Maternal Grandmother   . Hypertension Maternal Grandfather     Social History Social History   Tobacco Use  . Smoking status: Never Smoker  . Smokeless tobacco: Never Used  Substance Use Topics  . Alcohol use: Yes  . Drug use: Never     Allergies   Aspirin   Review of Systems Review of Systems  Constitutional: Positive for fatigue. Negative for chills and fever.  HENT: Negative for congestion.   Eyes: Positive for visual disturbance.  Respiratory:  Negative for cough, chest tightness and shortness of breath.   Cardiovascular: Negative for chest pain.  Gastrointestinal: Positive for nausea. Negative for abdominal pain, diarrhea and vomiting.  Genitourinary: Negative for dysuria.  Musculoskeletal: Negative for back pain and neck pain.  Skin: Negative for color change, rash and wound.  Neurological: Positive for tremors ("feels shakey"), weakness (Global) and light-headedness. Negative for syncope, speech difficulty and headaches.  All other systems reviewed and are negative.    Physical Exam Updated Vital Signs BP 120/87   Pulse 76   Temp 97.8 F (36.6 C) (Oral)   Resp 18   LMP 09/18/2017 (Approximate)   SpO2 100%    Physical Exam Vitals signs and nursing note reviewed.  Constitutional:      General: She is not in acute distress.    Appearance: She is well-developed. She is not ill-appearing.  HENT:     Head: Normocephalic and atraumatic.     Mouth/Throat:     Mouth: Mucous membranes are moist.  Eyes:     General: No visual field deficit.    Conjunctiva/sclera: Conjunctivae normal.     Comments: No conjunctival pallor.   Neck:     Musculoskeletal: Neck supple.  Cardiovascular:     Rate and Rhythm: Normal rate and regular rhythm.     Pulses: Normal pulses.     Heart sounds: Normal heart sounds. No murmur.  Pulmonary:     Effort: Pulmonary effort is normal. No respiratory distress.     Breath sounds: Normal breath sounds.  Abdominal:     General: There is no distension.     Palpations: Abdomen is soft.     Tenderness: There is no abdominal tenderness.  Musculoskeletal:     Right lower leg: No edema.     Left lower leg: No edema.  Skin:    General: Skin is warm and dry.  Neurological:     General: No focal deficit present.     Mental Status: She is alert.     GCS: GCS eye subscore is 4. GCS verbal subscore is 5. GCS motor subscore is 6.     Cranial Nerves: No cranial nerve deficit.     Comments: Vision is grossly intact with glasses on.   Psychiatric:        Attention and Perception: Attention normal.        Mood and Affect: Mood is anxious.        Behavior: Behavior normal.      ED Treatments / Results  Labs (all labs ordered are listed, but only abnormal results are displayed) Labs Reviewed  CBC WITH DIFFERENTIAL/PLATELET - Abnormal; Notable for the following components:      Result Value   Hemoglobin 11.5 (*)    MCV 75.7 (*)    MCH 24.1 (*)    RDW 17.2 (*)    All other components within normal limits  NOVEL CORONAVIRUS, NAA (HOSP ORDER, SEND-OUT TO REF LAB; TAT 18-24 HRS)  COMPREHENSIVE METABOLIC PANEL  URINALYSIS, ROUTINE W REFLEX MICROSCOPIC  PREGNANCY, URINE  SAMPLE  TO BLOOD BANK  TROPONIN I (HIGH SENSITIVITY)  TROPONIN I (HIGH SENSITIVITY)    EKG EKG Interpretation  Date/Time:  Wednesday April 29 2019 12:12:17 EST Ventricular Rate:  79 PR Interval:    QRS Duration: 135 QT Interval:  437 QTC Calculation: 501 R Axis:   40 Text Interpretation: Sinus rhythm Left bundle branch block Baseline wander in lead(s) V3 No old tracing to compare Confirmed by Lacretia Leigh ([id number])  on 04/29/2019 2:06:36 PM   Radiology No results found.  Procedures Procedures (including critical care time)  Medications Ordered in ED Medications  sodium chloride 0.9 % bolus 1,000 mL (0 mLs Intravenous Stopped 04/29/19 1720)  LORazepam (ATIVAN) injection 0.5 mg (0.5 mg Intravenous Given 04/29/19 1604)     Initial Impression / Assessment and Plan / ED Course  I have reviewed the triage vital signs and the nursing notes.  Pertinent labs & imaging results that were available during my care of the patient were reviewed by me and considered in my medical decision making (see chart for details).  Clinical Course as of Apr 28 2057  Wed Apr 29, 2019  1738 Patient reevaluated.  She is feeling better.  She remains hemodynamically stable.  We discussed results including that she is not significantly anemic today.  We discussed appropriate quarantine precautions.  She states that with her glasses on she can see me with out difficulty.     [EH]    Clinical Course User Index [EH] Lorin Glass, PA-C   Orthostatic VS for the past 24 hrs (Last 3 readings):  BP- Lying Pulse- Lying BP- Sitting Pulse- Sitting BP- Standing at 0 minutes Pulse- Standing at 0 minutes BP- Standing at 3 minutes Pulse- Standing at 3 minutes  04/29/19 1316 131/79 83 125/83 88 109/81 90 (!) 126/107 94        Patient presents today for evaluation of feeling unwell.  She has a history of anemia requiring transfusions approximately twice a year and she feels like she may be anemic. Hemoglobin today  is 11.5 which is minimally anemic.  She does not require blood transfusion.  CMP is reassuring.  UA without evidence of dehydration or infection.  Pregnancy test is negative.  Troponin x2 normal.  EKG shows a left bundle block, do not have previous to compare, however patient is not having chest pain or shortness of breath.  Given troponin x2 is normal do not suspect ACS.  She is PERC negative, do not suspect PE.  Patient was treated with IV saline after she reported she stated she felt better.  She also appeared anxious on exam.  She was given 0.5 mg of Ativan.  She did not have evidence of orthostatic hypotension.  After being told she did not need a blood transfusion her anxiety appeared to improve.   Patient was seen as a shared visit with Dr. Zenia Resides.    Suspect possible viral process such as influenza-like illness or COVID-19.  COVID-19 testing was sent.  We discussed the importance of quarantining at home along with the possibility of false negative testing.  Patient was observed in the emergency room for 6 hours without deterioration of condition.  She is instructed to follow-up with her PCP.  Return precautions were discussed with patient who states their understanding.  At the time of discharge patient denied any unaddressed complaints or concerns.  Patient is agreeable for discharge home.   Final Clinical Impressions(s) / ED Diagnoses   Final diagnoses:  Feeling unwell    ED Discharge Orders    None       Ollen Gross 04/29/19 2058    Lacretia Leigh, MD 04/30/19 [phone]

## 2019-04-29 NOTE — Telephone Encounter (Signed)
Dr. Matthews FYI 

## 2019-04-29 NOTE — ED Provider Notes (Signed)
Medical screening examination/treatment/procedure(s) were conducted as a shared visit with non-physician practitioner(s) and myself.  I personally evaluated the patient during the encounter.  EKG Interpretation  Date/Time:  Wednesday April 29 2019 12:12:17 EST Ventricular Rate:  79 PR Interval:    QRS Duration: 135 QT Interval:  437 QTC Calculation: 501 R Axis:   40 Text Interpretation: Sinus rhythm Left bundle branch block Baseline wander in lead(s) V3 No old tracing to compare Confirmed by Lacretia Leigh ([id number]) on 04/29/2019 2:64:82 PM 48 year old female presents with acute onset of weakness.  Patient concerned that she may be anemic.  She has no evidence of anemia here as hemoglobin is above 11.  Will do delta troponin and likely discharge home   Lacretia Leigh, MD 04/29/19 1550

## 2019-04-30 ENCOUNTER — Telehealth: Payer: Self-pay | Admitting: *Deleted

## 2019-04-30 LAB — NOVEL CORONAVIRUS, NAA (HOSP ORDER, SEND-OUT TO REF LAB; TAT 18-24 HRS): SARS-CoV-2, NAA: NOT DETECTED

## 2019-04-30 NOTE — Telephone Encounter (Signed)
Patient given covid negative results ,assisted with my chart .

## 2019-05-07 NOTE — Progress Notes (Signed)
[place] [phone]   Fax:([phone]) [place] NOTE  Patient Care Team: Luetta Nutting, DO as PCP - General (Family Medicine)  Hematological/Oncological History  # Iron Deficiency Anemia 1) 12/03/2018: WBC 5.3, Hgb 12.5, Plt 401. MCV 76.2. Iron 28, TIBC 455, Sat 6%, ferritin 7 2) 03/03/2019: WBC 7.0, Hgb 11.8, Plt 345. MCV 76.6. Iron 24, TIBC 436, Sat 6%, ferritin 5 3) 04/29/2019: WBC 7.0, Hgb 11.5, Plt 363. MCV 75.7  4) 05/08/2019: establish care with Dr. Lorenso Courier   CHIEF COMPLAINTS/PURPOSE OF CONSULTATION:  Iron deficiency anemia  HISTORY OF PRESENTING ILLNESS:  Meghan Dawson 48 y.o. female with medical history significant for iron deficiency anemia who presents for evaluation and management.   On review of prior records Meghan Dawson has had iron deficiency since at least July 2020.  At that time she was noted to have a hemoglobin of 12.5.  Subsequently she was found to have a hemoglobin of 11.8 in October, and most recently a hemoglobin 11.5 in December.  She has been taking oral iron for over 1 year at 325 mg daily.  Due to concern for this are deficiency anemia she was referred to hematology for further evaluation and management.  On exam today Meghan Dawson notes that she feels well.  She reports having issues with fatigue and clammy hands.  She also notes that her eyes tend to gloss over and that her energy levels are low particularly at work.  She reports that she does not have any cravings for ice or leg spasms.  She reports taking her iron pills daily in the morning with a source of vitamin C, but does endorse having issues with constipation and dark stools.    On further discussion she notes the most prominent source of bleeding are her heavy menstrual cycles.  She reports having irregular cycles the last 7 days at a time.  She notes that for the first 5 days these periods tend to require double pads and she soaks through them approximately every  2 hours.  She is currently being evaluated by her PCP for possible fibroids.  She denies any off cycle bleeding or any other sources of bleeding including nosebleeds or bruising. A full 10 point ROS is listed below.    MEDICAL HISTORY:  Past Medical History:  Diagnosis Date  . Allergy   . Blood transfusion without reported diagnosis   . Iron deficiency anemia     SURGICAL HISTORY: Past Surgical History:  Procedure Laterality Date  . APPENDECTOMY      SOCIAL HISTORY: Social History   Socioeconomic History  . Marital status: Married    Spouse name: Not on file  . Number of children: Not on file  . Years of education: Not on file  . Highest education level: Not on file  Occupational History  . Not on file  Tobacco Use  . Smoking status: Never Smoker  . Smokeless tobacco: Never Used  Substance and Sexual Activity  . Alcohol use: Yes  . Drug use: Never  . Sexual activity: Yes  Other Topics Concern  . Not on file  Social History Narrative  . Not on file   Social Determinants of Health   Financial Resource Strain:   . Difficulty of Paying Living Expenses: Not on file  Food Insecurity:   . Worried About Charity fundraiser in the Last Year: Not on file  . Ran Out of Food in the Last Year: Not on file  Transportation Needs:   . Film/video editor (Medical): Not on file  . Lack of Transportation (Non-Medical): Not on file  Physical Activity:   . Days of Exercise per Week: Not on file  . Minutes of Exercise per Session: Not on file  Stress:   . Feeling of Stress : Not on file  Social Connections:   . Frequency of Communication with Friends and Family: Not on file  . Frequency of Social Gatherings with Friends and Family: Not on file  . Attends Religious Services: Not on file  . Active Member of Clubs or Organizations: Not on file  . Attends Archivist Meetings: Not on file  . Marital Status: Not on file  Intimate Partner Violence:   . Fear of Current  or Ex-Partner: Not on file  . Emotionally Abused: Not on file  . Physically Abused: Not on file  . Sexually Abused: Not on file    FAMILY HISTORY: Family History  Problem Relation Age of Onset  . Hypertension Mother   . Miscarriages / [place] Mother   . Hyperlipidemia Mother   . Hypertension Father   . Heart disease Father   . Diabetes Father   . Hypertension Sister   . Miscarriages / Stillbirths Sister   . Diabetes Sister   . Hypertension Brother   . Hypertension Maternal Grandmother   . Hypertension Maternal Grandfather     ALLERGIES:  is allergic to aspirin.  MEDICATIONS:  Current Outpatient Medications  Medication Sig Dispense Refill  . BIOTIN PO Take 3 tablets by mouth daily.    Marland Kitchen ESTROGENS CONJ SYNTHETIC B PO Take 2 tablets by mouth every morning.    . ferrous sulfate 325 (65 FE) MG tablet Take 325 mg by mouth daily with breakfast.    . Multiple Vitamins-Minerals (MULTIPLE VITAMINS/WOMENS PO) Take 2 Pieces by mouth daily.    . Omega-3 Fatty Acids (FISH OIL) 1000 MG CPDR Take 1,000 mg by mouth daily.    . vitamin C (ASCORBIC ACID) 250 MG tablet Take 250 mg by mouth daily.     No current facility-administered medications for this visit.    REVIEW OF SYSTEMS:   Constitutional: ( - ) fevers, ( - )  chills , ( - ) night sweats Eyes: ( - ) blurriness of vision, ( - ) double vision, ( - ) watery eyes Ears, nose, mouth, throat, and face: ( - ) mucositis, ( - ) sore throat Respiratory: ( - ) cough, ( - ) dyspnea, ( - ) wheezes Cardiovascular: ( - ) palpitation, ( - ) chest discomfort, ( - ) lower extremity swelling Gastrointestinal:  ( - ) nausea, ( - ) heartburn, ( - ) change in bowel habits Skin: ( - ) abnormal skin rashes Lymphatics: ( - ) new lymphadenopathy, ( - ) easy bruising Neurological: ( - ) numbness, ( - ) tingling, ( - ) new weaknesses Behavioral/Psych: ( - ) mood change, ( - ) new changes  All other systems were reviewed with the patient and are  negative.  PHYSICAL EXAMINATION: ECOG PERFORMANCE STATUS: 0 - Asymptomatic  Vitals:   05/08/19 1402  BP: 132/81  Pulse: 90  Resp: 16  Temp: 98 F (36.7 C)  SpO2: 100%   Filed Weights   05/08/19 1402  Weight: 169 lb 6.4 oz (76.8 kg)    GENERAL: well appearing middle aged [place] American female in NAD  SKIN: skin color, texture, turgor are normal, no rashes or significant lesions EYES: conjunctiva  are pink and non-injected, sclera clear LUNGS: clear to auscultation and percussion with normal breathing effort HEART: regular rate & rhythm and no murmurs and no lower extremity edema ABDOMEN: soft, non-tender, non-distended, normal bowel sounds Musculoskeletal: no cyanosis of digits and no clubbing  PSYCH: alert & oriented x 3, fluent speech NEURO: no focal motor/sensory deficits  LABORATORY DATA:  I have reviewed the data as listed Lab Results  Component Value Date   WBC 7.0 04/29/2019   HGB 11.5 (L) 04/29/2019   HCT 36.2 04/29/2019   MCV 75.7 (L) 04/29/2019   PLT 363 04/29/2019   NEUTROABS 3.7 04/29/2019    PATHOLOGY: None to review  BLOOD FILM:  Review of the peripheral blood smear showed normal appearing white cells with neutrophils that were appropriately lobated and granulated. There was no predominance of bi-lobed or hyper-segmented neutrophils appreciated. No Dohle bodies were noted. There was no left shifting, immature forms or blasts noted. Lymphocytes remain normal in size without any predominance of large granular lymphocytes. Red cells show no anisopoikilocytosis, macrocytes , microcytes or polychromasia. There were no schistocytes, target cells, echinocytes, acanthocytes, dacrocytes, or stomatocytes.There was no rouleaux formation, nucleated red cells, or intra-cellular inclusions noted. The platelets are normal in size, shape, and color without any clumping evident.  RADIOGRAPHIC STUDIES: None relevant to review.  No results found.  ASSESSMENT &  PLAN Meghan Dawson 48 y.o. female with medical history significant for iron deficiency anemia who presents for evaluation and management.  After review of the labs and discussion with the patient her findings are most consistent with an iron deficiency anemia secondary to GYN blood loss.  The patient reports that she has abnormal periods which are particularly heavy requiring 2 pads and changes every 2 hours.  Her PCP is currently in the process of evaluating her for fibroids or other sources of heavy GYN bleeding.  There is referred hematology for further evaluation of the iron deficiency.    At this time her iron deficiency is not particular severe given her hemoglobin of near normal levels of 11.5.  In the event that her hemoglobin were to drop considerably more we would be willing consider IV iron, however at this time p.o. iron supplementation appears to be adequate to keep her anemia today.  She is taking iron appropriately and we do recommend that she continue it at this time.  Control of GYN bleeding will eventually lead to control of the anemia, and in the interim p.o. iron supplementation should be more than adequate.  In the event this changes we be more than happy to reconsider her for IV iron therapy.  We will have her return in 3 months time for recheck of her baseline labs.  Additionally we will collect B12 and MMA level today given her past history of requiring vitamin B12 shots.   # Iron Deficiency Anemia 2/2 to GYN Blood Loss -- today will order CBC, CMP, Iron panel, and retic panel --source of her iron deficiency is GYN blood loss in the form of her heavy irregular periods (menometrorhaggia). This is currently being evaluated by her PCP for possible fibroids. --control of the GYN bleeding will lead to better control of the iron deficiency --given she has near normal Hgb with iron deficiency we recommend continuation of the PO iron supplementation with ferrous sulfate 325 mg daily PO  with a source of vitamin C --no clear indication for IV iron at this time, however if Hgb were to worsen we could consider  this in the future --if control of GYN bleeding does not improve anemia should consider GI evaluation, though she currently has no signs of GI bleed --RTC in 3 months time for further evaluation.   #Vitamin B12 deficiency --required Vitamin B12 shots in the past, not currently requiring these --will collect Vitamin B12/folate, MMA, and homocysteine --continue to monitor   Orders Placed This Encounter  Procedures  . CBC with Differential (Cancer Center Only)    Standing Status:   Future    Number of Occurrences:   1    Standing Expiration Date:   05/07/2020  . Retic Panel    Standing Status:   Future    Number of Occurrences:   1    Standing Expiration Date:   05/07/2020  . Save Smear ([facility])    Standing Status:   Future    Number of Occurrences:   1    Standing Expiration Date:   05/07/2020  . CMP ([place] only)    Standing Status:   Future    Number of Occurrences:   1    Standing Expiration Date:   05/07/2020  . Iron and TIBC    Standing Status:   Future    Number of Occurrences:   1    Standing Expiration Date:   05/07/2020  . Ferritin    Standing Status:   Future    Number of Occurrences:   1    Standing Expiration Date:   05/07/2020  . Vitamin B12    Standing Status:   Future    Number of Occurrences:   1    Standing Expiration Date:   05/07/2020  . Methylmalonic acid, serum    Standing Status:   Future    Number of Occurrences:   1    Standing Expiration Date:   05/07/2020  . Folate, Serum    Standing Status:   Future    Number of Occurrences:   1    Standing Expiration Date:   05/07/2020  . Homocysteine, serum    Standing Status:   Future    Number of Occurrences:   1    Standing Expiration Date:   05/07/2020    All questions were answered. The patient knows to call the clinic with any problems, questions or concerns.  A total  of more than 60 minutes were spent on this encounter and over half of that time was spent on counseling and coordination of care as outlined above.   Ledell Peoples, MD Department of Hematology/Oncology [place] at [facility] Phone: [phone] Pager: [phone] Email: [email or web address]  05/08/2019 2:51 PM

## 2019-05-08 ENCOUNTER — Encounter: Payer: Self-pay | Admitting: *Deleted

## 2019-05-08 ENCOUNTER — Other Ambulatory Visit: Payer: Self-pay

## 2019-05-08 ENCOUNTER — Inpatient Hospital Stay (HOSPITAL_BASED_OUTPATIENT_CLINIC_OR_DEPARTMENT_OTHER): Payer: Commercial Managed Care - PPO | Admitting: Hematology and Oncology

## 2019-05-08 ENCOUNTER — Inpatient Hospital Stay: Payer: Commercial Managed Care - PPO | Attending: Hematology and Oncology

## 2019-05-08 VITALS — BP 132/81 | HR 90 | Temp 98.0°F | Resp 16 | Ht 69.0 in | Wt 169.4 lb

## 2019-05-08 DIAGNOSIS — E538 Deficiency of other specified B group vitamins: Secondary | ICD-10-CM | POA: Insufficient documentation

## 2019-05-08 DIAGNOSIS — D500 Iron deficiency anemia secondary to blood loss (chronic): Secondary | ICD-10-CM

## 2019-05-08 DIAGNOSIS — N920 Excessive and frequent menstruation with regular cycle: Secondary | ICD-10-CM | POA: Insufficient documentation

## 2019-05-08 LAB — CMP (CANCER CENTER ONLY)
ALT: 16 U/L (ref 0–44)
AST: 16 U/L (ref 15–41)
Albumin: 4.2 g/dL (ref 3.5–5.0)
Alkaline Phosphatase: 69 U/L (ref 38–126)
Anion gap: 8 (ref 5–15)
BUN: 13 mg/dL (ref 6–20)
CO2: 26 mmol/L (ref 22–32)
Calcium: 9.3 mg/dL (ref 8.9–10.3)
Chloride: 104 mmol/L (ref 98–111)
Creatinine: 0.81 mg/dL (ref 0.44–1.00)
GFR, Est AFR Am: >60 mL/min (ref >60)
GFR, Estimated: >60 mL/min (ref >60)
Glucose, Bld: 89 mg/dL (ref 70–99)
Potassium: 4.1 mmol/L (ref 3.5–5.1)
Sodium: 138 mmol/L (ref 135–145)
Total Bilirubin: 0.2 mg/dL — ABNORMAL LOW (ref 0.3–1.2)
Total Protein: 7.7 g/dL (ref 6.5–8.1)

## 2019-05-08 LAB — RETIC PANEL
Immature Retic Fract: 8.9 % (ref 2.3–15.9)
RBC.: 4.71 MIL/uL (ref 3.87–5.11)
Retic Count, Absolute: 61.2 10*3/uL (ref 19.0–186.0)
Retic Ct Pct: 1.3 % (ref 0.4–3.1)
Reticulocyte Hemoglobin: 24.1 pg — ABNORMAL LOW (ref >27.9)

## 2019-05-08 LAB — CBC WITH DIFFERENTIAL (CANCER CENTER ONLY)
Abs Immature Granulocytes: 0.01 10*3/uL (ref 0.00–0.07)
Basophils Absolute: 0.1 10*3/uL (ref 0.0–0.1)
Basophils Relative: 1 %
Eosinophils Absolute: 0.2 10*3/uL (ref 0.0–0.5)
Eosinophils Relative: 3 %
HCT: 35.4 % — ABNORMAL LOW (ref 36.0–46.0)
Hemoglobin: 11.4 g/dL — ABNORMAL LOW (ref 12.0–15.0)
Immature Granulocytes: 0 %
Lymphocytes Relative: 39 %
Lymphs Abs: 2.2 10*3/uL (ref 0.7–4.0)
MCH: 23.8 pg — ABNORMAL LOW (ref 26.0–34.0)
MCHC: 32.2 g/dL (ref 30.0–36.0)
MCV: 74.1 fL — ABNORMAL LOW (ref 80.0–100.0)
Monocytes Absolute: 0.5 10*3/uL (ref 0.1–1.0)
Monocytes Relative: 9 %
Neutro Abs: 2.8 10*3/uL (ref 1.7–7.7)
Neutrophils Relative %: 48 %
Platelet Count: 300 10*3/uL (ref 150–400)
RBC: 4.78 MIL/uL (ref 3.87–5.11)
RDW: 17.1 % — ABNORMAL HIGH (ref 11.5–15.5)
WBC Count: 5.7 10*3/uL (ref 4.0–10.5)
nRBC: 0 % (ref 0.0–0.2)

## 2019-05-08 LAB — VITAMIN B12: Vitamin B-12: 370 pg/mL (ref 180–914)

## 2019-05-08 LAB — FOLATE: Folate: 97.1 ng/mL (ref >5.9)

## 2019-05-08 LAB — SAVE SMEAR(SSMR), FOR PROVIDER SLIDE REVIEW

## 2019-05-11 LAB — IRON AND TIBC
Iron: 28 ug/dL — ABNORMAL LOW (ref 41–142)
Saturation Ratios: 6 % — ABNORMAL LOW (ref 21–57)
TIBC: 501 ug/dL — ABNORMAL HIGH (ref 236–444)
UIBC: 474 ug/dL — ABNORMAL HIGH (ref 120–384)

## 2019-05-11 LAB — HOMOCYSTEINE: Homocysteine: 13.4 umol/L (ref 0.0–14.5)

## 2019-05-11 LAB — FERRITIN: Ferritin: 5 ng/mL — ABNORMAL LOW (ref 11–307)

## 2019-05-13 LAB — METHYLMALONIC ACID, SERUM: Methylmalonic Acid, Quantitative: 146 nmol/L (ref 0–378)

## 2019-08-07 ENCOUNTER — Other Ambulatory Visit: Payer: Commercial Managed Care - PPO

## 2019-08-07 ENCOUNTER — Ambulatory Visit: Payer: Commercial Managed Care - PPO | Admitting: Hematology and Oncology

## 2019-08-11 ENCOUNTER — Other Ambulatory Visit: Payer: Self-pay | Admitting: Hematology and Oncology

## 2019-08-11 DIAGNOSIS — D500 Iron deficiency anemia secondary to blood loss (chronic): Secondary | ICD-10-CM

## 2019-08-12 ENCOUNTER — Inpatient Hospital Stay: Payer: Commercial Managed Care - PPO | Attending: Hematology and Oncology

## 2019-08-12 DIAGNOSIS — E538 Deficiency of other specified B group vitamins: Secondary | ICD-10-CM | POA: Insufficient documentation

## 2019-08-12 DIAGNOSIS — D500 Iron deficiency anemia secondary to blood loss (chronic): Secondary | ICD-10-CM | POA: Insufficient documentation

## 2019-08-16 ENCOUNTER — Other Ambulatory Visit: Payer: Self-pay | Admitting: Hematology and Oncology

## 2019-08-16 DIAGNOSIS — D500 Iron deficiency anemia secondary to blood loss (chronic): Secondary | ICD-10-CM

## 2019-08-17 ENCOUNTER — Inpatient Hospital Stay (HOSPITAL_BASED_OUTPATIENT_CLINIC_OR_DEPARTMENT_OTHER): Payer: Commercial Managed Care - PPO | Admitting: Hematology and Oncology

## 2019-08-17 ENCOUNTER — Other Ambulatory Visit: Payer: Self-pay

## 2019-08-17 ENCOUNTER — Inpatient Hospital Stay: Payer: Commercial Managed Care - PPO

## 2019-08-17 VITALS — BP 134/90 | HR 84 | Temp 98.2°F | Resp 18 | Ht 69.0 in | Wt 180.8 lb

## 2019-08-17 DIAGNOSIS — D500 Iron deficiency anemia secondary to blood loss (chronic): Secondary | ICD-10-CM

## 2019-08-17 DIAGNOSIS — E538 Deficiency of other specified B group vitamins: Secondary | ICD-10-CM | POA: Diagnosis not present

## 2019-08-17 LAB — IRON AND TIBC
Iron: 18 ug/dL — ABNORMAL LOW (ref 41–142)
Iron: 20 ug/dL — ABNORMAL LOW (ref 41–142)
Saturation Ratios: 3 % — ABNORMAL LOW (ref 21–57)
Saturation Ratios: 4 % — ABNORMAL LOW (ref 21–57)
TIBC: 561 ug/dL — ABNORMAL HIGH (ref 236–444)
TIBC: 566 ug/dL — ABNORMAL HIGH (ref 236–444)
UIBC: 542 ug/dL — ABNORMAL HIGH (ref 120–384)
UIBC: 548 ug/dL — ABNORMAL HIGH (ref 120–384)

## 2019-08-17 LAB — CMP (CANCER CENTER ONLY)
ALT: 16 U/L (ref 0–44)
AST: 18 U/L (ref 15–41)
Albumin: 4.2 g/dL (ref 3.5–5.0)
Alkaline Phosphatase: 69 U/L (ref 38–126)
Anion gap: 8 (ref 5–15)
BUN: 8 mg/dL (ref 6–20)
CO2: 28 mmol/L (ref 22–32)
Calcium: 9.2 mg/dL (ref 8.9–10.3)
Chloride: 103 mmol/L (ref 98–111)
Creatinine: 0.77 mg/dL (ref 0.44–1.00)
GFR, Est AFR Am: >60 mL/min (ref >60)
GFR, Estimated: >60 mL/min (ref >60)
Glucose, Bld: 81 mg/dL (ref 70–99)
Potassium: 3.7 mmol/L (ref 3.5–5.1)
Sodium: 139 mmol/L (ref 135–145)
Total Bilirubin: 0.3 mg/dL (ref 0.3–1.2)
Total Protein: 8.1 g/dL (ref 6.5–8.1)

## 2019-08-17 LAB — CBC WITH DIFFERENTIAL (CANCER CENTER ONLY)
Abs Immature Granulocytes: 0.01 10*3/uL (ref 0.00–0.07)
Basophils Absolute: 0.1 10*3/uL (ref 0.0–0.1)
Basophils Relative: 1 %
Eosinophils Absolute: 0.2 10*3/uL (ref 0.0–0.5)
Eosinophils Relative: 3 %
HCT: 36.9 % (ref 36.0–46.0)
Hemoglobin: 11.6 g/dL — ABNORMAL LOW (ref 12.0–15.0)
Immature Granulocytes: 0 %
Lymphocytes Relative: 43 %
Lymphs Abs: 2.4 10*3/uL (ref 0.7–4.0)
MCH: 23.1 pg — ABNORMAL LOW (ref 26.0–34.0)
MCHC: 31.4 g/dL (ref 30.0–36.0)
MCV: 73.5 fL — ABNORMAL LOW (ref 80.0–100.0)
Monocytes Absolute: 0.5 10*3/uL (ref 0.1–1.0)
Monocytes Relative: 10 %
Neutro Abs: 2.5 10*3/uL (ref 1.7–7.7)
Neutrophils Relative %: 43 %
Platelet Count: 409 10*3/uL — ABNORMAL HIGH (ref 150–400)
RBC: 5.02 MIL/uL (ref 3.87–5.11)
RDW: 17.6 % — ABNORMAL HIGH (ref 11.5–15.5)
WBC Count: 5.7 10*3/uL (ref 4.0–10.5)
nRBC: 0 % (ref 0.0–0.2)

## 2019-08-17 LAB — RETIC PANEL
Immature Retic Fract: 21.6 % — ABNORMAL HIGH (ref 2.3–15.9)
RBC.: 4.91 MIL/uL (ref 3.87–5.11)
Retic Count, Absolute: 70.7 10*3/uL (ref 19.0–186.0)
Retic Ct Pct: 1.4 % (ref 0.4–3.1)
Reticulocyte Hemoglobin: 26.1 pg — ABNORMAL LOW (ref >27.9)

## 2019-08-17 LAB — FERRITIN
Ferritin: <4 ng/mL — ABNORMAL LOW (ref 11–307)
Ferritin: <4 ng/mL — ABNORMAL LOW (ref 11–307)

## 2019-08-17 LAB — SAVE SMEAR(SSMR), FOR PROVIDER SLIDE REVIEW

## 2019-08-18 ENCOUNTER — Encounter: Payer: Self-pay | Admitting: Hematology and Oncology

## 2019-08-18 NOTE — Progress Notes (Signed)
[place] [phone]   [phone]  PROGRESS NOTE  Patient Care Team: Luetta Nutting, DO as PCP - General (Family Medicine)  Hematological/Oncological History # Iron Deficiency Anemia 1) 12/03/2018: WBC 5.3, Hgb 12.5, Plt 401. MCV 76.2. Iron 28, TIBC 455, Sat 6%, ferritin 7 2) 03/03/2019: WBC 7.0, Hgb 11.8, Plt 345. MCV 76.6. Iron 24, TIBC 436, Sat 6%, ferritin 5 3) 04/29/2019: WBC 7.0, Hgb 11.5, Plt 363. MCV 75.7  4) 05/08/2019: establish care with Dr. Lorenso Courier. Continued on PO iron 325mg  daily.  5) 08/17/2019: WBC 5.7, Hgb 11.6, MCV 73.5, Plt 409.   Interval History:  Meghan Dawson 49 y.o. female with medical history significant for iron deficiency anemia 2/2 to GYN blood loss who presents for a follow up visit. The patient's last visit was on 05/08/2019 at which time she established care in our clinic. In the interim since the last visit Meghan Dawson has visited with OB/GYN who notes that there is no intervention is required at this time.  On exam today Meghan Dawson notes that she has had no changes in her health since our last visit.  She notes that she has been keeping up with the p.o. iron supplementation and has not been causing any issues with constipation or abdominal pain.  She also continues to have ice cravings and heavy periods.  She notes that her last.  Actually occurred twice in 1 month and that she was having "heavy flow".  She notes that she goes to about 1 tampon 2 pads per day while she is having her periods.  She notes that she has been eating foods that she does not like simply because they have a high iron content including liver and beats.  The patient reports that otherwise she has had no major changes in her health since her last evaluation in December.  A full 10 point ROS is listed below.  MEDICAL HISTORY:  Past Medical History:  Diagnosis Date  . Allergy   . Blood transfusion without reported diagnosis   . Iron deficiency anemia      SURGICAL HISTORY: Past Surgical History:  Procedure Laterality Date  . APPENDECTOMY      SOCIAL HISTORY: Social History   Socioeconomic History  . Marital status: Married    Spouse name: Not on file  . Number of children: Not on file  . Years of education: Not on file  . Highest education level: Not on file  Occupational History  . Not on file  Tobacco Use  . Smoking status: Never Smoker  . Smokeless tobacco: Never Used  Substance and Sexual Activity  . Alcohol use: Yes  . Drug use: Never  . Sexual activity: Yes  Other Topics Concern  . Not on file  Social History Narrative  . Not on file   Social Determinants of Health   Financial Resource Strain:   . Difficulty of Paying Living Expenses:   Food Insecurity:   . Worried About Charity fundraiser in the Last Year:   . Arboriculturist in the Last Year:   Transportation Needs:   . Film/video editor (Medical):   Marland Kitchen Lack of Transportation (Non-Medical):   Physical Activity:   . Days of Exercise per Week:   . Minutes of Exercise per Session:   Stress:   . Feeling of Stress :   Social Connections:   . Frequency of Communication with Friends and Family:   . Frequency of Social Gatherings  with Friends and Family:   . Attends Religious Services:   . Active Member of Clubs or Organizations:   . Attends Archivist Meetings:   Marland Kitchen Marital Status:   Intimate Partner Violence:   . Fear of Current or Ex-Partner:   . Emotionally Abused:   Marland Kitchen Physically Abused:   . Sexually Abused:     FAMILY HISTORY: Family History  Problem Relation Age of Onset  . Hypertension Mother   . Miscarriages / [place] Mother   . Hyperlipidemia Mother   . Hypertension Father   . Heart disease Father   . Diabetes Father   . Hypertension Sister   . Miscarriages / Stillbirths Sister   . Diabetes Sister   . Hypertension Brother   . Hypertension Maternal Grandmother   . Hypertension Maternal Grandfather     ALLERGIES:   is allergic to aspirin.  MEDICATIONS:  Current Outpatient Medications  Medication Sig Dispense Refill  . BIOTIN PO Take 3 tablets by mouth daily.    Marland Kitchen ESTROGENS CONJ SYNTHETIC B PO Take 2 tablets by mouth every morning.    . ferrous sulfate 325 (65 FE) MG tablet Take 325 mg by mouth daily with breakfast.    . Multiple Vitamins-Minerals (MULTIPLE VITAMINS/WOMENS PO) Take 2 Pieces by mouth daily.    . Omega-3 Fatty Acids (FISH OIL) 1000 MG CPDR Take 1,000 mg by mouth daily.    . vitamin C (ASCORBIC ACID) 250 MG tablet Take 250 mg by mouth daily.     No current facility-administered medications for this visit.    REVIEW OF SYSTEMS:   Constitutional: ( - ) fevers, ( - )  chills , ( - ) night sweats Eyes: ( - ) blurriness of vision, ( - ) double vision, ( - ) watery eyes Ears, nose, mouth, throat, and face: ( - ) mucositis, ( - ) sore throat Respiratory: ( - ) cough, ( - ) dyspnea, ( - ) wheezes Cardiovascular: ( - ) palpitation, ( - ) chest discomfort, ( - ) lower extremity swelling Gastrointestinal:  ( - ) nausea, ( - ) heartburn, ( - ) change in bowel habits Skin: ( - ) abnormal skin rashes Lymphatics: ( - ) new lymphadenopathy, ( - ) easy bruising Neurological: ( - ) numbness, ( - ) tingling, ( - ) new weaknesses Behavioral/Psych: ( - ) mood change, ( - ) new changes  All other systems were reviewed with the patient and are negative.  PHYSICAL EXAMINATION: ECOG PERFORMANCE STATUS: 1 - Symptomatic but completely ambulatory  Vitals:   08/17/19 1421  BP: 134/90  Pulse: 84  Resp: 18  Temp: 98.2 F (36.8 C)  SpO2: 100%   Filed Weights   08/17/19 1421  Weight: 180 lb 12.8 oz (82 kg)    GENERAL: well appearing middle aged [place] American female in NAD  SKIN: skin color, texture, turgor are normal, no rashes or significant lesions EYES: conjunctiva are pink and non-injected, sclera clear LUNGS: clear to auscultation and percussion with normal breathing effort HEART: regular  rate & rhythm and no murmurs and no lower extremity edema ABDOMEN: soft, non-tender, non-distended, normal bowel sounds Musculoskeletal: no cyanosis of digits and no clubbing  PSYCH: alert & oriented x 3, fluent speech NEURO: no focal motor/sensory deficits  LABORATORY DATA:  I have reviewed the data as listed CBC Latest Ref Rng & Units 08/17/2019 05/08/2019 04/29/2019  WBC 4.0 - 10.5 K/uL 5.7 5.7 7.0  Hemoglobin 12.0 - 15.0 g/dL 11.6(L)  11.4(L) 11.5(L)  Hematocrit 36.0 - 46.0 % 36.9 35.4(L) 36.2  Platelets 150 - 400 K/uL 409(H) 300 363    CMP Latest Ref Rng & Units 08/17/2019 05/08/2019 04/29/2019  Glucose 70 - 99 mg/dL 81 89 86  BUN 6 - 20 mg/dL 8 13 13   Creatinine 0.44 - 1.00 mg/dL 0.77 0.81 0.69  Sodium 135 - 145 mmol/L 139 138 139  Potassium 3.5 - 5.1 mmol/L 3.7 4.1 3.7  Chloride 98 - 111 mmol/L 103 104 104  CO2 22 - 32 mmol/L 28 26 24   Calcium 8.9 - 10.3 mg/dL 9.2 9.3 9.3  Total Protein 6.5 - 8.1 g/dL 8.1 7.7 7.7  Total Bilirubin 0.3 - 1.2 mg/dL 0.3 0.2(L) 0.5  Alkaline Phos 38 - 126 U/L 69 69 60  AST 15 - 41 U/L 18 16 20   ALT 0 - 44 U/L 16 16 20    Iron/TIBC/Ferritin/ %Sat    Component Value Date/Time   IRON 20 (L) 08/17/2019 1403   TIBC 561 (H) 08/17/2019 1403   FERRITIN <4 (L) 08/17/2019 1403   IRONPCTSAT 4 (L) 08/17/2019 1403   IRONPCTSAT 6 (L) 03/03/2019 1454    RADIOGRAPHIC STUDIES: None relevant to review.  No results found.  ASSESSMENT & PLAN LASHONDRIA SILVERWOOD 49 y.o. female with medical history significant for iron deficiency anemia 2/2 to GYN blood loss who presents for a follow up visit.  After review the labs and discussion with the patient her findings are consistent with a continued iron deficiency anemia despite p.o. iron therapy.  The patient notes that she has tolerated the p.o. therapy well without any constipation or abdominal pain.  Unfortunately this has not improved her iron levels and has not caused any considerable increase in her hemoglobin.  As  such I would recommend at this time we proceed with IV iron therapy.  I would recommend IV Feraheme 510 mg q. 7 days x 2 doses in order to raise her hemoglobin and improve her iron stores.  In the interim the patient has been evaluated by OB/GYN who notes that she is "too close to menopause" and that no further inventions are required from their standpoint at this time.  There is no clear evidence of any GI bleeding and therefore I do not think colonoscopy would be required at this time.  We will supply the patient with IV iron, however in the event that this does not improve her hemoglobin we would need to consider more aggressive approaches to controlling her bleeding.  # Iron Deficiency Anemia 2/2 to GYN Blood Loss --continued PO iron supplementation with a source of vitamin C did not improve the patient's iron stores and had a minimal impact on Hgb --patient underwent GYN evaluation and reportedly no intervention was recommend as she is relatively close to menopause --given these findings, I would recommend having the patient received IV Feraheme 510mg  q 7days x 2 doses  --plan to have the patient return to clinic 4 weeks after her last dose of IV iron to assure improvement in the Hgb.   #Vitamin B12 deficiency --required Vitamin B12 shots in the past, not currently requiring these --prior normal levels of Vitamin B12/folate, MMA, and homocysteine --continue to monitor   No orders of the defined types were placed in this encounter.   All questions were answered. The patient knows to call the clinic with any problems, questions or concerns.  A total of more than 30 minutes were spent on this encounter and over half of  that time was spent on counseling and coordination of care as outlined above.   Meghan Peoples, MD Department of Hematology/Oncology [place] at [facility] Phone: [phone] Pager: [phone] Email: [email or web address]  08/18/2019  7:16 PM

## 2019-11-17 ENCOUNTER — Encounter: Payer: Commercial Managed Care - PPO | Admitting: Family Medicine

## 2020-03-09 ENCOUNTER — Ambulatory Visit (INDEPENDENT_AMBULATORY_CARE_PROVIDER_SITE_OTHER): Payer: 59 | Admitting: Obstetrics and Gynecology

## 2020-03-09 ENCOUNTER — Other Ambulatory Visit (HOSPITAL_COMMUNITY)
Admission: RE | Admit: 2020-03-09 | Discharge: 2020-03-09 | Disposition: A | Discharge status: Home / Self Care | Payer: 59 | Source: Ambulatory Visit | Attending: Obstetrics and Gynecology | Admitting: Obstetrics and Gynecology

## 2020-03-09 ENCOUNTER — Encounter: Payer: Self-pay | Admitting: Obstetrics and Gynecology

## 2020-03-09 ENCOUNTER — Other Ambulatory Visit: Payer: Self-pay

## 2020-03-09 VITALS — BP 117/70 | HR 101 | Ht 59.0 in | Wt 189.5 lb

## 2020-03-09 DIAGNOSIS — Z0000 Encounter for general adult medical examination without abnormal findings: Secondary | ICD-10-CM

## 2020-03-09 DIAGNOSIS — D500 Iron deficiency anemia secondary to blood loss (chronic): Secondary | ICD-10-CM | POA: Diagnosis not present

## 2020-03-09 DIAGNOSIS — Z113 Encounter for screening for infections with a predominantly sexual mode of transmission: Secondary | ICD-10-CM

## 2020-03-09 DIAGNOSIS — N921 Excessive and frequent menstruation with irregular cycle: Secondary | ICD-10-CM

## 2020-03-09 NOTE — Progress Notes (Signed)
GYNECOLOGY ANNUAL PREVENTATIVE CARE ENCOUNTER NOTE CC: annual exam, irregular heavy bleeding  History:     Meghan Dawson is a 49 y.o. [phone] female here for a routine annual gynecologic exam.  Current complaints: irregular heavy menstrual bleeding.    The patient noted that she has always had heavy menstrual cycles..  Over the last 3 months she has had two cycles per month and increased discomfort.  Prior to the last 3 months her cycles were regular.  Menses lasted 8 days.  Cycles are heaviest on days 5 and 6, and she has passed some clots.  Pt thinks she may already be anemic.  She notes she had received some oral medication to stop the bleeding from her previous provider, but she cannot remember what it was.  The patient has had no intervention or testing to address the bleeding and has has never tried OCP.   Gynecologic History Patient's last menstrual period was 02/23/2020. Contraception: tubal ligation Last Pap: pt does not recall.  Last mammogram: pt does not recall.  Obstetric History OB History  Gravida Para Term Preterm AB Living  4 3 2 1 1 3   SAB TAB Ectopic Multiple Live Births  1       3    # Outcome Date GA Lbr Len/2nd Weight Sex Delivery Anes PTL Lv  4 Term 11/04/96    M Vag-Spont   LIV  3 Term 10/22/95    M Vag-Spont   LIV  2 Preterm 06/03/90    F Vag-Spont   LIV  1 SAB             Past Medical History:  Diagnosis Date   Allergy    Blood transfusion without reported diagnosis    Iron deficiency anemia     Past Surgical History:  Procedure Laterality Date   APPENDECTOMY     TUBAL LIGATION  1998    Current Outpatient Medications on File Prior to Visit  Medication Sig Dispense Refill   BIOTIN PO Take 3 tablets by mouth daily.     ESTROGENS CONJ SYNTHETIC B PO Take 2 tablets by mouth every morning.     ferrous sulfate 325 (65 FE) MG tablet Take 325 mg by mouth daily with breakfast.     Multiple Vitamins-Minerals (MULTIPLE VITAMINS/WOMENS PO) Take  2 Pieces by mouth daily.     Omega-3 Fatty Acids (FISH OIL) 1000 MG CPDR Take 1,000 mg by mouth daily.     vitamin C (ASCORBIC ACID) 250 MG tablet Take 250 mg by mouth daily.     No current facility-administered medications on file prior to visit.    Allergies  Allergen Reactions   Aspirin Hives and Swelling    Social History:  reports that she has never smoked. She has never used smokeless tobacco. She reports current alcohol use. She reports that she does not use drugs.  Family History  Problem Relation Age of Onset   Hypertension Mother    Miscarriages / [place] Mother    Hyperlipidemia Mother    Diabetes Mother    Hypertension Father    Heart disease Father    Diabetes Father    Hypertension Sister    Miscarriages / Stillbirths Sister    Diabetes Sister    Hypertension Brother    Hypertension Maternal Grandmother    Hypertension Maternal Grandfather     The following portions of the patient's history were reviewed and updated as appropriate: allergies, current medications, past family history, past  medical history, past social history, past surgical history and problem list.  Review of Systems Pertinent items noted in HPI and remainder of comprehensive ROS otherwise negative.  Physical Exam:  BP 117/70    Pulse (!) 101    Ht 4\' 11"  (1.499 m)    Wt 189 lb 8 oz (86 kg)    LMP 02/23/2020    BMI 38.27 kg/m  CONSTITUTIONAL: Well-developed, well-nourished female in no acute distress.  HENT:  Normocephalic, atraumatic, External right and left ear normal. Oropharynx is clear and moist EYES: Conjunctivae and EOM are normal. Pupils are equal, round, and reactive to light. No scleral icterus.  NECK: Normal range of motion, supple, no masses.  Normal thyroid.  SKIN: Skin is warm and dry. No rash noted. Not diaphoretic. No erythema. No pallor.  Possible fungal lesion, erythematous below each breast, non -pruritic MUSCULOSKELETAL: Normal range of motion. No  tenderness.  No cyanosis, clubbing, or edema.  2+ distal pulses. NEUROLOGIC: Alert and oriented to person, place, and time. Normal reflexes, muscle tone coordination.  PSYCHIATRIC: Normal mood and affect. Normal behavior. Normal judgment and thought content. CARDIOVASCULAR: Normal heart rate noted, regular rhythm RESPIRATORY: Clear to auscultation bilaterally. Effort and breath sounds normal, no problems with respiration noted. BREASTS: Symmetric in size. No masses, tenderness, skin changes, nipple drainage, or lymphadenopathy bilaterally. Performed in the presence of a chaperone. ABDOMEN: Soft, no distention noted.  No tenderness, rebound or guarding.  PELVIC: Normal appearing external genitalia and urethral meatus; normal appearing vaginal mucosa and cervix.  No abnormal discharge noted.  Pap smear obtained.  Normal uterine size, no other palpable masses, no uterine or adnexal tenderness.  Performed in the presence of a chaperone.   Assessment and Plan:    1. Iron deficiency anemia due to chronic blood loss Will check h/h to see level of anemia - Hemoglobin and hematocrit, blood  2. Well adult exam  - Cytology - PAP( [place]) - MM Digital Screening; Future  3. Menorrhagia with irregular cycle Check pelvic u/s for structural lesions ie fibroids Pt will need endometrial biopsy in 2-3 weeks, she has been counseled to premedicate with ibuprofen 30-60 minutes before the procedure.  Pending endo biopsy results, we have discussed OCP, lysteda, ablation , progesterone IUD as treat emnt options with hysterectomy as a last resort. - Follicle stimulating hormone - LH - Prolactin - TSH - US PELVIC COMPLETE WITH TRANSVAGINAL; Future - Hemoglobin and hematocrit, blood  4. Screening examination for STD (sexually transmitted disease)  - Cervicovaginal ancillary only( [place])  Will follow up results of pap smear and manage accordingly. Mammogram scheduled Routine preventative health  maintenance measures emphasized. Please refer to After Visit Summary for other counseling recommendations.      Lynnda Shields, MD, [place] for [facility], [place]

## 2020-03-09 NOTE — Progress Notes (Signed)
Patient presents as New Patient for abnormal, heavy cycles. She states that her last cycle lasted for 8 days that is really heavy. She also states that when she is on she changes 2 pads and a tampax every 4 hours. Her cycles have been coming twice a month for the past 3 months. She states that her cramps are very painful as well.  Patient states that her last Pap and MM was done in [place] in March 2020  She states that her provider in [place] was in the process of sending her for an U/S to check for fibriods. Patient did not completed this work up there due to relocating.

## 2020-03-09 NOTE — Patient Instructions (Signed)
Menorrhagia Menorrhagia is when your menstrual periods are heavy or last longer than normal. Follow these instructions at home: Medicines   Take over-the-counter and prescription medicines exactly as told by your doctor. This includes iron pills.  Do not change or switch medicines without asking your doctor.  Do not take aspirin or medicines that contain aspirin 1 week before or during your period. Aspirin may make bleeding worse. General instructions  If you need to change your pad or tampon more than once every 2 hours, limit your activity until the bleeding stops.  Iron pills can cause problems when pooping (constipation). To prevent or treat pooping problems while taking prescription iron pills, your doctor may suggest that you: ? Drink enough fluid to keep your pee (urine) clear or pale yellow. ? Take over-the-counter or prescription medicines. ? Eat foods that are high in fiber. These foods include:  Fresh fruits and vegetables.  Whole grains.  Beans. ? Limit foods that are high in fat and processed sugars. This includes fried and sweet foods.  Eat healthy meals and foods that are high in iron. Foods that have a lot of iron include: ? Leafy green vegetables. ? Meat. ? Liver. ? Eggs. ? Whole grain breads and cereals.  Do not try to lose weight until your heavy bleeding has stopped and you have normal amounts of iron in your blood. If you need to lose weight, work with your doctor.  Keep all follow-up visits as told by your doctor. This is important. Contact a doctor if:  You soak through a pad or tampon every 1 or 2 hours, and this happens every time you have a period.  You need to use pads and tampons at the same time because you are bleeding so much.  You are taking medicine and you: ? Feel sick to your stomach (nauseous). ? Throw up (vomit). ? Have watery poop (diarrhea).  You have other problems that may be related to the medicine you are taking. Get help  right away if:  You soak through more than a pad or tampon in 1 hour.  You pass clots bigger than 1 inch (2.5 cm) wide.  You feel short of breath.  You feel like your heart is beating too fast.  You feel dizzy or you pass out (faint).  You feel very weak or tired. Summary  Menorrhagia is when your menstrual periods are heavy or last longer than normal.  Take over-the-counter and prescription medicines exactly as told by your doctor. This includes iron pills.  Contact a doctor if you soak through more than a pad or tampon in 1 hour or are passing large clots. This information is not intended to replace advice given to you by your health care provider. Make sure you discuss any questions you have with your health care provider. Document Revised: 08/14/2017 Document Reviewed: 05/28/2016 Elsevier Patient Education  2020 [employer]  

## 2020-03-10 LAB — CERVICOVAGINAL ANCILLARY ONLY
Bacterial Vaginitis (gardnerella): POSITIVE — AB
Candida Glabrata: NEGATIVE
Candida Vaginitis: NEGATIVE
Chlamydia: NEGATIVE
Comment: NEGATIVE
Comment: NEGATIVE
Comment: NEGATIVE
Comment: NEGATIVE
Comment: NEGATIVE
Comment: NORMAL
Neisseria Gonorrhea: NEGATIVE
Trichomonas: NEGATIVE

## 2020-03-10 LAB — PROLACTIN: Prolactin: 13.6 ng/mL (ref 4.8–23.3)

## 2020-03-10 LAB — TSH: TSH: 1.75 u[IU]/mL (ref 0.450–4.500)

## 2020-03-10 LAB — FOLLICLE STIMULATING HORMONE: FSH: 5.5 m[IU]/mL

## 2020-03-10 LAB — HEMOGLOBIN AND HEMATOCRIT, BLOOD
Hematocrit: 34.2 % (ref 34.0–46.6)
Hemoglobin: 11.3 g/dL (ref 11.1–15.9)

## 2020-03-10 LAB — LUTEINIZING HORMONE: LH: 10.2 m[IU]/mL

## 2020-03-11 LAB — CYTOLOGY - PAP
Comment: NEGATIVE
Diagnosis: NEGATIVE
Diagnosis: REACTIVE
High risk HPV: NEGATIVE

## 2020-03-22 ENCOUNTER — Ambulatory Visit: Payer: 59

## 2020-03-30 ENCOUNTER — Other Ambulatory Visit: Payer: Self-pay

## 2020-03-30 ENCOUNTER — Ambulatory Visit
Admission: RE | Admit: 2020-03-30 | Discharge: 2020-03-30 | Disposition: A | Discharge status: Home / Self Care | Payer: 59 | Source: Ambulatory Visit | Attending: Obstetrics and Gynecology | Admitting: Obstetrics and Gynecology

## 2020-03-30 ENCOUNTER — Ambulatory Visit: Payer: 59 | Admitting: Obstetrics and Gynecology

## 2020-03-30 DIAGNOSIS — N921 Excessive and frequent menstruation with irregular cycle: Secondary | ICD-10-CM | POA: Insufficient documentation

## 2020-04-20 ENCOUNTER — Encounter: Payer: Self-pay | Admitting: Obstetrics and Gynecology

## 2020-04-20 ENCOUNTER — Ambulatory Visit (INDEPENDENT_AMBULATORY_CARE_PROVIDER_SITE_OTHER): Payer: 59 | Admitting: Obstetrics and Gynecology

## 2020-04-20 ENCOUNTER — Other Ambulatory Visit (HOSPITAL_COMMUNITY)
Admission: RE | Admit: 2020-04-20 | Discharge: 2020-04-20 | Disposition: A | Discharge status: Home / Self Care | Payer: 59 | Source: Ambulatory Visit | Attending: Obstetrics and Gynecology | Admitting: Obstetrics and Gynecology

## 2020-04-20 ENCOUNTER — Other Ambulatory Visit: Payer: Self-pay

## 2020-04-20 VITALS — BP 130/85 | HR 98 | Wt 194.2 lb

## 2020-04-20 DIAGNOSIS — N921 Excessive and frequent menstruation with irregular cycle: Secondary | ICD-10-CM | POA: Diagnosis present

## 2020-04-20 LAB — POCT URINE PREGNANCY: Preg Test, Ur: NEGATIVE

## 2020-04-20 NOTE — Patient Instructions (Addendum)
Endometrial Ablation Endometrial ablation is a procedure that destroys the thin inner layer of the lining of the uterus (endometrium). This procedure may be done:  To stop heavy periods.  To stop bleeding that is causing anemia.  To control irregular bleeding.  To treat bleeding caused by small tumors (fibroids) in the endometrium. This procedure is often an alternative to major surgery, such as removal of the uterus and cervix (hysterectomy). As a result of this procedure:  You may not be able to have children. However, if you are premenopausal (you have not gone through menopause): ? You may still have a small chance of getting pregnant. ? You will need to use a reliable method of birth control after the procedure to prevent pregnancy.  You may stop having a menstrual period, or you may have only a small amount of bleeding during your period. Menstruation may return several years after the procedure. Tell a health care provider about:  Any allergies you have.  All medicines you are taking, including vitamins, herbs, eye drops, creams, and over-the-counter medicines.  Any problems you or family members have had with the use of anesthetic medicines.  Any blood disorders you have.  Any surgeries you have had.  Any medical conditions you have. What are the risks? Generally, this is a safe procedure. However, problems may occur, including:  A hole (perforation) in the uterus or bowel.  Infection of the uterus, bladder, or vagina.  Bleeding.  Damage to other structures or organs.  An air bubble in the lung (air embolus).  Problems with pregnancy after the procedure.  Failure of the procedure.  Decreased ability to diagnose cancer in the endometrium. What happens before the procedure?  You will have tests of your endometrium to make sure there are no pre-cancerous cells or cancer cells present.  You may have an ultrasound of the uterus.  You may be given medicines to  thin the endometrium.  Ask your health care provider about: ? Changing or stopping your regular medicines. This is especially important if you take diabetes medicines or blood thinners. ? Taking medicines such as aspirin and ibuprofen. These medicines can thin your blood. Do not take these medicines before your procedure if your doctor tells you not to.  Plan to have someone take you home from the hospital or clinic. What happens during the procedure?   You will lie on an exam table with your feet and legs supported as in a pelvic exam.  To lower your risk of infection: ? Your health care team will wash or sanitize their hands and put on germ-free (sterile) gloves. ? Your genital area will be washed with soap.  An IV tube will be inserted into one of your veins.  You will be given a medicine to help you relax (sedative).  A surgical instrument with a light and camera (resectoscope) will be inserted into your vagina and moved into your uterus. This allows your surgeon to see inside your uterus.  Endometrial tissue will be removed using one of the following methods: ? Radiofrequency. This method uses a radiofrequency-alternating electric current to remove the endometrium. ? Cryotherapy. This method uses extreme cold to freeze the endometrium. ? Heated-free liquid. This method uses a heated saltwater (saline) solution to remove the endometrium. ? Microwave. This method uses high-energy microwaves to heat up the endometrium and remove it. ? Thermal balloon. This method involves inserting a catheter with a balloon tip into the uterus. The balloon tip is filled with   heated fluid to remove the endometrium. The procedure may vary among health care providers and hospitals. What happens after the procedure?  Your blood pressure, heart rate, breathing rate, and blood oxygen level will be monitored until the medicines you were given have worn off.  As tissue healing occurs, you may notice  vaginal bleeding for 4-6 weeks after the procedure. You may also experience: ? Cramps. ? Thin, watery vaginal discharge that is light pink or brown in color. ? A need to urinate more frequently than usual. ? Nausea.  Do not drive for 24 hours if you were given a sedative.  Do not have sex or insert anything into your vagina until your health care provider approves. Summary  Endometrial ablation is done to treat the many causes of heavy menstrual bleeding.  The procedure may be done only after medications have been tried to control the bleeding.  Plan to have someone take you home from the hospital or clinic. This information is not intended to replace advice given to you by your health care provider. Make sure you discuss any questions you have with your health care provider. Document Revised: 10/22/2017 Document Reviewed: 05/24/2016 Elsevier Patient Education  [place]. Uterine Fibroids  Uterine fibroids (leiomyomas) are noncancerous (benign) tumors that can develop in the uterus. Fibroids may also develop in the fallopian tubes, cervix, or tissues (ligaments) near the uterus. You may have one or many fibroids. Fibroids vary in size, weight, and where they grow in the uterus. Some can become quite large. Most fibroids do not require medical treatment. What are the causes? The cause of this condition is not known. What increases the risk? You are more likely to develop this condition if you:  Are in your 30s or 40s and have not gone through menopause.  Have a family history of this condition.  Are of African-American descent.  Had your first period at an early age (early menarche).  Have not had any children (nulliparity).  Are overweight or obese. What are the signs or symptoms? Many women do not have any symptoms. Symptoms of this condition may include:  Heavy menstrual bleeding.  Bleeding or spotting between periods.  Pain and pressure in the pelvic area,  between the hips.  Bladder problems, such as needing to urinate urgently or more often than usual.  Inability to have children (infertility).  Failure to carry pregnancy to term (miscarriage). How is this diagnosed? This condition may be diagnosed based on:  Your symptoms and medical history.  A physical exam.  A pelvic exam that includes feeling for any tumors.  Imaging tests, such as ultrasound or MRI. How is this treated? Treatment for this condition may include:  Seeing your health care provider for follow-up visits to monitor your fibroids for any changes.  Taking NSAIDs such as ibuprofen, naproxen, or aspirin to reduce pain.  Hormone medicines. These may be taken as a pill, given in an injection, or delivered by a T-shaped device that is inserted into the uterus (intrauterine device, IUD).  Surgery to remove one of the following: ? The fibroids (myomectomy). Your health care provider may recommend this if fibroids affect your fertility and you want to become pregnant. ? The uterus (hysterectomy). ? Blood supply to the fibroids (uterine artery embolization). Follow these instructions at home:  Take over-the-counter and prescription medicines only as told by your health care provider.  Ask your health care provider if you should take iron pills or eat more iron-rich foods, such  as dark green, leafy vegetables. Heavy menstrual bleeding can cause low iron levels.  If directed, apply heat to your back or abdomen to reduce pain. Use the heat source that your health care provider recommends, such as a moist heat pack or a heating pad. ? Place a towel between your skin and the heat source. ? Leave the heat on for 20-30 minutes. ? Remove the heat if your skin turns bright red. This is especially important if you are unable to feel pain, heat, or cold. You may have a greater risk of getting burned.  Pay close attention to your menstrual cycle. Tell your health care provider about  any changes, such as: ? Increased blood flow that requires you to use more pads or tampons than usual. ? A change in the number of days that your period lasts. ? A change in symptoms that are associated with your period, such as back pain or cramps in your abdomen.  Keep all follow-up visits as told by your health care provider. This is important, especially if your fibroids need to be monitored for any changes. Contact a health care provider if you:  Have pelvic pain, back pain, or cramps in your abdomen that do not get better with medicine or heat.  Develop new bleeding between periods.  Have increased bleeding during or between periods.  Feel unusually tired or weak.  Feel light-headed. Get help right away if you:  Faint.  Have pelvic pain that suddenly gets worse.  Have severe vaginal bleeding that soaks a tampon or pad in 30 minutes or less. Summary  Uterine fibroids are noncancerous (benign) tumors that can develop in the uterus.  The exact cause of this condition is not known.  Most fibroids do not require medical treatment unless they affect your ability to have children (fertility).  Contact a health care provider if you have pelvic pain, back pain, or cramps in your abdomen that do not get better with medicines.  Make sure you know what symptoms should cause you to get help right away. This information is not intended to replace advice given to you by your health care provider. Make sure you discuss any questions you have with your health care provider. Document Revised: 04/19/2017 Document Reviewed: 04/02/2017 Elsevier Patient Education  [employer]. Hysteroscopy Hysteroscopy is a procedure that is used to examine the inside of a woman's womb (uterus). This may be done for various reasons, including:  To look for lumps (tumors) and other growths in the uterus.  To evaluate abnormal bleeding, fibroid tumors, polyps, scar tissue (adhesions), or cancer of the  uterus.  To determine the cause of an inability to get pregnant (infertility) or repeated losses of pregnancies (miscarriages).  To find a lost IUD (intrauterine device).  To perform a procedure that permanently prevents pregnancy (sterilization). During this procedure, a thin, flexible tube with a small light and camera (hysteroscope) is used to examine the uterus. The camera sends images to a monitor in the room so that your health care provider can view the inside of your uterus. A hysteroscopy should be done right after a menstrual period to make sure that you are not pregnant. Tell a health care provider about:  Any allergies you have.  All medicines you are taking, including vitamins, herbs, eye drops, creams, and over-the-counter medicines.  Any problems you or family members have had with the use of anesthetic medicines.  Any blood disorders you have.  Any surgeries you have had.  Any medical conditions you have.  Whether you are pregnant or may be pregnant. What are the risks? Generally, this is a safe procedure. However, problems may occur, including:  Excessive bleeding.  Infection.  Damage to the uterus or other structures or organs.  Allergic reaction to medicines or fluids that are used in the procedure. What happens before the procedure? Staying hydrated Follow instructions from your health care provider about hydration, which may include:  Up to 2 hours before the procedure - you may continue to drink clear liquids, such as water, clear fruit juice, black coffee, and plain tea. Eating and drinking restrictions Follow instructions from your health care provider about eating and drinking, which may include:  8 hours before the procedure - stop eating solid foods and drink clear liquids only  2 hours before the procedure - stop drinking clear liquids. General instructions  Ask your health care provider about: ? Changing or stopping your normal medicines.  This is important if you take diabetes medicines or blood thinners. ? Taking medicines such as aspirin and ibuprofen. These medicines can thin your blood and cause bleeding. Do not take these medicines for 1 week before your procedure, or as told by your health care provider.  Do not use any products that contain nicotine or tobacco for 2 weeks before the procedure. This includes cigarettes and e-cigarettes. If you need help quitting, ask your health care provider.  Medicine may be placed in your cervix the day before the procedure. This medicine causes the cervix to have a larger opening (dilate). The larger opening makes it easier for the hysteroscope to be inserted into the uterus during the procedure.  Plan to have someone with you for the first 24-48 hours after the procedure, especially if you are given a medicine to make you fall asleep (general anesthetic).  Plan to have someone take you home from the hospital or clinic. What happens during the procedure?  To lower your risk of infection: ? Your health care team will wash or sanitize their hands. ? Your skin will be washed with soap. ? Hair may be removed from the surgical area.  An IV tube will be inserted into one of your veins.  You may be given one or more of the following: ? A medicine to help you relax (sedative). ? A medicine that numbs the area around the cervix (local anesthetic). ? A medicine to make you fall asleep (general anesthetic).  A hysteroscope will be inserted through your vagina and into your uterus.  Air or fluid will be used to enlarge your uterus, enabling your health care provider to see your uterus better. The amount of fluid used will be carefully checked throughout the procedure.  In some cases, tissue may be gently scraped from inside the uterus and sent to a lab for testing (biopsy). The procedure may vary among health care providers and hospitals. What happens after the procedure?  Your blood  pressure, heart rate, breathing rate, and blood oxygen level will be monitored until the medicines you were given have worn off.  You may have some cramping. You may be given medicines for this.  You may have bleeding, which varies from light spotting to menstrual-like bleeding. This is normal.  If you had a biopsy done, it is your responsibility to get the results of your procedure. Ask your health care provider, or the department performing the procedure, when your results will be ready. Summary  Hysteroscopy is a procedure that is  used to examine the inside of a woman's womb (uterus).  After the procedure, you may have bleeding, which varies from light spotting to menstrual-like bleeding. This is normal. You may also have cramping.  Plan to have someone take you home from the hospital or clinic. This information is not intended to replace advice given to you by your health care provider. Make sure you discuss any questions you have with your health care provider. Document Revised: 04/19/2017 Document Reviewed: 06/05/2016 Elsevier Patient Education  [place].

## 2020-04-20 NOTE — Progress Notes (Signed)
ENDOMETRIAL BIOPSY      Meghan Dawson is a 49 y.o. [phone] here for endometrial biopsy.  The indications for endometrial biopsy were reviewed.  Risks of the biopsy including cramping, bleeding, infection, uterine perforation, inadequate specimen and need for additional procedures were discussed. The patient states she understands and agrees to undergo procedure today. Consent was signed. Time out was performed.   Indications: irregular menstrual bleeding Urine HCG: neg  A bivalve speculum was placed into the vagina and the cervix was easily visualized and was prepped with Betadine x2. A single-toothed tenaculum was placed on the anterior lip of the cervix to stabilize it. The 3 mm pipelle was introduced into the endometrial cavity without difficulty to a depth of 7.5 cm, and a moderate amount of tissue was obtained and sent to pathology.  The tissue had the appearance of an endometrial polyp. This was repeated for a total of 2 passes. The instruments were removed from the patient's vagina. Minimal bleeding from the cervix at the tenaculum was noted.   The patient tolerated the procedure well. Routine post-procedure instructions were given to the patient.    Will base further management on results of biopsy.  At this time, pending results, will schedule hysteroscopy D and C with myosure followed by novasure uterine ablation.  Lynnda Shields, MD

## 2020-04-20 NOTE — Progress Notes (Signed)
Pt presents for EBx d/t menorrhagia w/ irregular cycles  UPT- negative

## 2020-04-21 ENCOUNTER — Encounter: Payer: Self-pay | Admitting: Obstetrics and Gynecology

## 2020-04-22 LAB — SURGICAL PATHOLOGY

## 2020-05-03 ENCOUNTER — Other Ambulatory Visit: Payer: Self-pay

## 2020-05-03 ENCOUNTER — Other Ambulatory Visit (INDEPENDENT_AMBULATORY_CARE_PROVIDER_SITE_OTHER): Payer: 59 | Admitting: Obstetrics and Gynecology

## 2020-05-03 ENCOUNTER — Encounter (HOSPITAL_COMMUNITY): Payer: Self-pay | Admitting: Obstetrics and Gynecology

## 2020-05-03 NOTE — Progress Notes (Addendum)
COVID Vaccine Completed: Yes Date COVID Vaccine completed: x2 COVID vaccine manufacturer: Pfizer      PCP - Dr. Vanita Panda  Cardiologist - N/A  Chest x-ray - N/A EKG - greater than 1 year Stress Test - greater than 2 years ECHO - N/A Cardiac Cath - N/A Pacemaker/ICD device last checked:N/A  Sleep Study - N/A CPAP - N/A  Fasting Blood Sugar - N/A Checks Blood Sugar __N/A___ times a day  Blood Thinner Instructions: N/A Aspirin Instructions: N/A Last Dose: N/A  Activity level: Able to exercise without symptoms     Anesthesia review: N/A  Patient denies shortness of breath, fever, cough and chest pain at PAT appointment   Patient verbalized understanding of instructions that were given to them at the PAT appointment. Patient was also instructed that they will need to review over the PAT instructions again at home before surgery.

## 2020-05-03 NOTE — Progress Notes (Signed)
preop orders

## 2020-05-09 ENCOUNTER — Other Ambulatory Visit (HOSPITAL_COMMUNITY)
Admission: RE | Admit: 2020-05-09 | Discharge: 2020-05-09 | Disposition: A | Discharge status: Home / Self Care | Payer: 59 | Source: Ambulatory Visit | Attending: Obstetrics and Gynecology | Admitting: Obstetrics and Gynecology

## 2020-05-09 DIAGNOSIS — Z01812 Encounter for preprocedural laboratory examination: Secondary | ICD-10-CM | POA: Insufficient documentation

## 2020-05-09 DIAGNOSIS — Z20822 Contact with and (suspected) exposure to covid-19: Secondary | ICD-10-CM | POA: Insufficient documentation

## 2020-05-09 LAB — SARS CORONAVIRUS 2 (TAT 6-24 HRS): SARS Coronavirus 2: NEGATIVE

## 2020-05-09 NOTE — Anesthesia Preprocedure Evaluation (Addendum)
Anesthesia Evaluation  Patient identified by MRN, date of birth, ID band Patient awake    Reviewed: Allergy & Precautions, NPO status , Patient's Chart, lab work & pertinent test results  Airway Mallampati: II  TM Distance: >3 FB Neck ROM: Full    Dental no notable dental hx. (+) Teeth Intact   Pulmonary neg pulmonary ROS,    Pulmonary exam normal breath sounds clear to auscultation       Cardiovascular Normal cardiovascular exam+ dysrhythmias  Rhythm:Regular Rate:Normal  EKG 04/28/20 NSR, LBBB pattern   Neuro/Psych  Headaches, negative psych ROS   GI/Hepatic negative GI ROS, Neg liver ROS,   Endo/Other  negative endocrine ROS  Renal/GU negative Renal ROS  negative genitourinary   Musculoskeletal negative musculoskeletal ROS (+)   Abdominal   Peds  Hematology  (+) anemia ,   Anesthesia Other Findings   Reproductive/Obstetrics AUB                            Anesthesia Physical Anesthesia Plan  ASA: II  Anesthesia Plan: General   Post-op Pain Management:    Induction: Intravenous  PONV Risk Score and Plan: 4 or greater and Ondansetron, Scopolamine patch - Pre-op, Midazolam, Dexamethasone and Treatment may vary due to age or medical condition  Airway Management Planned: LMA  Additional Equipment:   Intra-op Plan:   Post-operative Plan: Extubation in OR  Informed Consent: I have reviewed the patients History and Physical, chart, labs and discussed the procedure including the risks, benefits and alternatives for the proposed anesthesia with the patient or authorized representative who has indicated his/her understanding and acceptance.     Dental advisory given  Plan Discussed with: CRNA and Anesthesiologist  Anesthesia Plan Comments:        Anesthesia Quick Evaluation

## 2020-05-10 ENCOUNTER — Ambulatory Visit (HOSPITAL_COMMUNITY): Payer: 59 | Admitting: Certified Registered Nurse Anesthetist

## 2020-05-10 ENCOUNTER — Encounter (HOSPITAL_COMMUNITY)
Admission: RE | Disposition: A | Discharge status: Home / Self Care | Payer: Self-pay | Source: Home / Self Care | Attending: Obstetrics and Gynecology

## 2020-05-10 ENCOUNTER — Ambulatory Visit (HOSPITAL_COMMUNITY)
Admission: RE | Admit: 2020-05-10 | Discharge: 2020-05-10 | Disposition: A | Discharge status: Home / Self Care | Payer: 59 | Attending: Obstetrics and Gynecology | Admitting: Obstetrics and Gynecology

## 2020-05-10 ENCOUNTER — Encounter (HOSPITAL_COMMUNITY): Payer: Self-pay | Admitting: Obstetrics and Gynecology

## 2020-05-10 DIAGNOSIS — N840 Polyp of corpus uteri: Secondary | ICD-10-CM | POA: Insufficient documentation

## 2020-05-10 DIAGNOSIS — N921 Excessive and frequent menstruation with irregular cycle: Secondary | ICD-10-CM

## 2020-05-10 DIAGNOSIS — Z886 Allergy status to analgesic agent status: Secondary | ICD-10-CM | POA: Insufficient documentation

## 2020-05-10 DIAGNOSIS — N920 Excessive and frequent menstruation with regular cycle: Secondary | ICD-10-CM | POA: Insufficient documentation

## 2020-05-10 DIAGNOSIS — N841 Polyp of cervix uteri: Secondary | ICD-10-CM | POA: Diagnosis not present

## 2020-05-10 HISTORY — DX: Deficiency of other specified B group vitamins: E53.8

## 2020-05-10 HISTORY — DX: Headache, unspecified: R51.9

## 2020-05-10 HISTORY — PX: DILITATION & CURRETTAGE/HYSTROSCOPY WITH NOVASURE ABLATION: SHX5568

## 2020-05-10 HISTORY — DX: Excessive and frequent menstruation with regular cycle: N92.0

## 2020-05-10 LAB — TYPE AND SCREEN
ABO/RH(D): O POS
Antibody Screen: NEGATIVE

## 2020-05-10 LAB — CBC
HCT: 34.4 % — ABNORMAL LOW (ref 36.0–46.0)
Hemoglobin: 11.2 g/dL — ABNORMAL LOW (ref 12.0–15.0)
MCH: 24.5 pg — ABNORMAL LOW (ref 26.0–34.0)
MCHC: 32.6 g/dL (ref 30.0–36.0)
MCV: 75.3 fL — ABNORMAL LOW (ref 80.0–100.0)
Platelets: 272 10*3/uL (ref 150–400)
RBC: 4.57 MIL/uL (ref 3.87–5.11)
RDW: 17.7 % — ABNORMAL HIGH (ref 11.5–15.5)
WBC: 5.4 10*3/uL (ref 4.0–10.5)
nRBC: 0 % (ref 0.0–0.2)

## 2020-05-10 LAB — PREGNANCY, URINE: Preg Test, Ur: NEGATIVE

## 2020-05-10 LAB — ABO/RH: ABO/RH(D): O POS

## 2020-05-10 SURGERY — DILATATION & CURETTAGE/HYSTEROSCOPY WITH NOVASURE ABLATION
Anesthesia: General | Site: Vagina

## 2020-05-10 MED ORDER — OXYCODONE HCL 5 MG PO TABS: 5.0000 mg | ORAL_TABLET | Freq: Once | ORAL | Status: DC | PRN

## 2020-05-10 MED ORDER — DEXAMETHASONE SODIUM PHOSPHATE 10 MG/ML IJ SOLN
INTRAMUSCULAR | Status: AC
  Filled 2020-05-10: qty 1

## 2020-05-10 MED ORDER — KETOROLAC TROMETHAMINE 30 MG/ML IJ SOLN
INTRAMUSCULAR | Status: AC
  Filled 2020-05-10: qty 1

## 2020-05-10 MED ORDER — CEFAZOLIN SODIUM-DEXTROSE 2-4 GM/100ML-% IV SOLN
2.0000 g | INTRAVENOUS | Status: AC
  Administered 2020-05-10: 08:00:00 2 g via INTRAVENOUS
  Filled 2020-05-10: qty 100

## 2020-05-10 MED ORDER — EPHEDRINE SULFATE-NACL 50-0.9 MG/10ML-% IV SOSY
PREFILLED_SYRINGE | INTRAVENOUS | Status: DC | PRN
  Administered 2020-05-10 (×2): 5 mg via INTRAVENOUS

## 2020-05-10 MED ORDER — FENTANYL CITRATE (PF) 100 MCG/2ML IJ SOLN
INTRAMUSCULAR | Status: AC
  Filled 2020-05-10: qty 2

## 2020-05-10 MED ORDER — PHENYLEPHRINE 40 MCG/ML (10ML) SYRINGE FOR IV PUSH (FOR BLOOD PRESSURE SUPPORT)
PREFILLED_SYRINGE | INTRAVENOUS | Status: AC
  Filled 2020-05-10: qty 10

## 2020-05-10 MED ORDER — LACTATED RINGERS IV SOLN: INTRAVENOUS | Status: DC

## 2020-05-10 MED ORDER — LIDOCAINE HCL (PF) 2 % IJ SOLN
INTRAMUSCULAR | Status: AC
  Filled 2020-05-10: qty 5

## 2020-05-10 MED ORDER — PHENYLEPHRINE 40 MCG/ML (10ML) SYRINGE FOR IV PUSH (FOR BLOOD PRESSURE SUPPORT)
PREFILLED_SYRINGE | INTRAVENOUS | Status: DC | PRN
  Administered 2020-05-10: 120 ug via INTRAVENOUS
  Administered 2020-05-10: 80 ug via INTRAVENOUS
  Administered 2020-05-10: 120 ug via INTRAVENOUS

## 2020-05-10 MED ORDER — ORAL CARE MOUTH RINSE: 15.0000 mL | Freq: Once | OROMUCOSAL | Status: AC

## 2020-05-10 MED ORDER — MIDAZOLAM HCL 2 MG/2ML IJ SOLN
INTRAMUSCULAR | Status: AC
  Filled 2020-05-10: qty 2

## 2020-05-10 MED ORDER — CHLORHEXIDINE GLUCONATE 0.12 % MT SOLN
15.0000 mL | Freq: Once | OROMUCOSAL | Status: AC
  Administered 2020-05-10: 07:00:00 15 mL via OROMUCOSAL

## 2020-05-10 MED ORDER — POVIDONE-IODINE 10 % EX SWAB
2.0000 "application " | Freq: Once | CUTANEOUS | Status: AC
  Administered 2020-05-10: 2 via TOPICAL

## 2020-05-10 MED ORDER — LIDOCAINE 2% (20 MG/ML) 5 ML SYRINGE
INTRAMUSCULAR | Status: DC | PRN
  Administered 2020-05-10: 60 mg via INTRAVENOUS

## 2020-05-10 MED ORDER — ACETAMINOPHEN 500 MG PO TABS
1000.0000 mg | ORAL_TABLET | ORAL | Status: AC
  Administered 2020-05-10: 07:00:00 1000 mg via ORAL
  Filled 2020-05-10: qty 2

## 2020-05-10 MED ORDER — OXYCODONE HCL 5 MG/5ML PO SOLN
5.0000 mg | Freq: Once | ORAL | Status: DC | PRN
Start: 2020-05-10 — End: 2020-05-10

## 2020-05-10 MED ORDER — ONDANSETRON HCL 4 MG/2ML IJ SOLN
INTRAMUSCULAR | Status: AC
  Filled 2020-05-10: qty 2

## 2020-05-10 MED ORDER — SILVER NITRATE-POT NITRATE 75-25 % EX MISC
CUTANEOUS | Status: AC
  Filled 2020-05-10: qty 10

## 2020-05-10 MED ORDER — EPHEDRINE 5 MG/ML INJ
INTRAVENOUS | Status: AC
  Filled 2020-05-10: qty 10

## 2020-05-10 MED ORDER — OXYCODONE-ACETAMINOPHEN 5-325 MG PO TABS: 1.0000 | ORAL_TABLET | Freq: Four times a day (QID) | ORAL | 0 refills | Status: AC | PRN

## 2020-05-10 MED ORDER — SCOPOLAMINE 1 MG/3DAYS TD PT72
MEDICATED_PATCH | TRANSDERMAL | Status: DC | PRN
  Administered 2020-05-10: 1 via TRANSDERMAL

## 2020-05-10 MED ORDER — MIDAZOLAM HCL 2 MG/2ML IJ SOLN
INTRAMUSCULAR | Status: DC | PRN
  Administered 2020-05-10: 2 mg via INTRAVENOUS

## 2020-05-10 MED ORDER — ONDANSETRON HCL 4 MG/2ML IJ SOLN: 4.0000 mg | Freq: Once | INTRAMUSCULAR | Status: DC | PRN

## 2020-05-10 MED ORDER — FENTANYL CITRATE (PF) 100 MCG/2ML IJ SOLN
INTRAMUSCULAR | Status: DC | PRN
  Administered 2020-05-10: 25 ug via INTRAVENOUS
  Administered 2020-05-10 (×2): 50 ug via INTRAVENOUS
  Administered 2020-05-10: 25 ug via INTRAVENOUS
  Administered 2020-05-10: 50 ug via INTRAVENOUS

## 2020-05-10 MED ORDER — LIDOCAINE HCL (PF) 1 % IJ SOLN
INTRAMUSCULAR | Status: AC
  Filled 2020-05-10: qty 30

## 2020-05-10 MED ORDER — FENTANYL CITRATE (PF) 100 MCG/2ML IJ SOLN
25.0000 ug | INTRAMUSCULAR | Status: DC | PRN
Start: 2020-05-10 — End: 2020-05-10

## 2020-05-10 MED ORDER — PROPOFOL 10 MG/ML IV BOLUS
INTRAVENOUS | Status: DC | PRN
  Administered 2020-05-10: 150 mg via INTRAVENOUS

## 2020-05-10 MED ORDER — SODIUM CHLORIDE 0.9 % IR SOLN
Status: DC | PRN
  Administered 2020-05-10: 3000 mL

## 2020-05-10 MED ORDER — DEXAMETHASONE SODIUM PHOSPHATE 4 MG/ML IJ SOLN
INTRAMUSCULAR | Status: DC | PRN
  Administered 2020-05-10: 10 mg via INTRAVENOUS

## 2020-05-10 MED ORDER — SOD CITRATE-CITRIC ACID 500-334 MG/5ML PO SOLN
30.0000 mL | ORAL | Status: AC
  Administered 2020-05-10: 07:00:00 30 mL via ORAL
  Filled 2020-05-10: qty 15

## 2020-05-10 MED ORDER — PROPOFOL 10 MG/ML IV BOLUS
INTRAVENOUS | Status: AC
  Filled 2020-05-10: qty 20

## 2020-05-10 MED ORDER — SCOPOLAMINE 1 MG/3DAYS TD PT72
MEDICATED_PATCH | TRANSDERMAL | Status: AC
  Filled 2020-05-10: qty 1

## 2020-05-10 MED ORDER — ONDANSETRON HCL 4 MG/2ML IJ SOLN
INTRAMUSCULAR | Status: DC | PRN
  Administered 2020-05-10: 4 mg via INTRAVENOUS

## 2020-05-10 SURGICAL SUPPLY — 16 items
ABLATOR SURESOUND NOVASURE (ABLATOR) ×2 IMPLANT
CATH ROBINSON RED A/P 16FR (CATHETERS) ×2 IMPLANT
COVER WAND RF STERILE (DRAPES) ×2 IMPLANT
DEVICE MYOSURE REACH (MISCELLANEOUS) ×2 IMPLANT
GLOVE BIO SURGEON STRL SZ8 (GLOVE) ×2 IMPLANT
GLOVE BIOGEL PI IND STRL 7.0 (GLOVE) ×2 IMPLANT
GLOVE BIOGEL PI INDICATOR 7.0 (GLOVE) ×2
GOWN STRL REUS W/ TWL LRG LVL3 (GOWN DISPOSABLE) ×2 IMPLANT
GOWN STRL REUS W/TWL LRG LVL3 (GOWN DISPOSABLE) ×2
KIT PROCEDURE FLUENT (KITS) ×2 IMPLANT
KIT TURNOVER KIT B (KITS) ×2 IMPLANT
PACK VAGINAL MINOR WOMEN LF (CUSTOM PROCEDURE TRAY) ×2 IMPLANT
PAD OB MATERNITY 4.3X12.25 (PERSONAL CARE ITEMS) ×2 IMPLANT
PAD PREP 24X48 CUFFED NSTRL (MISCELLANEOUS) ×2 IMPLANT
SLEEVE SCD COMPRESS KNEE MED (MISCELLANEOUS) ×2 IMPLANT
TOWEL GREEN STERILE FF (TOWEL DISPOSABLE) ×4 IMPLANT

## 2020-05-10 NOTE — Anesthesia Postprocedure Evaluation (Addendum)
Anesthesia Post Note  Patient: Meghan Dawson  Procedure(s) Performed: DILATATION & CURETTAGE/HYSTEROSCOPY WITH NOVASURE ABLATION (N/A Vagina )     Patient location during evaluation: PACU Anesthesia Type: General Level of consciousness: awake and alert and oriented Pain management: pain level controlled Vital Signs Assessment: post-procedure vital signs reviewed and stable Respiratory status: spontaneous breathing, nonlabored ventilation and respiratory function stable Cardiovascular status: blood pressure returned to baseline and stable Postop Assessment: no apparent nausea or vomiting Anesthetic complications: no   No complications documented.  Last Vitals:  Vitals:   05/10/20 1000 05/10/20 1016  BP: 121/79 (!) 127/91  Pulse: 73 74  Resp: 13 16  Temp: (!) 36.4 C 36.4 C  SpO2: 99% 100%    Last Pain:  Vitals:   05/10/20 1016  TempSrc: Oral  PainSc:                  Ved Martos A.

## 2020-05-10 NOTE — Op Note (Signed)
PREOPERATIVE DIAGNOSIS:  menorrhagia POSTOPERATIVE DIAGNOSIS: menorrhagia with uterine polyp PROCEDURE:  Hysteroscopy,  Dilation and Curettage, NovaSure Endometrial Ablation, myosure resection of uterine polyp SURGEON:  Dr. Lynnda Shields  INDICATIONS: 49 y.o. [id number]  here for NovaSure Endometrial Ablation.  Risks of surgery were discussed with the patient including but not limited to: bleeding which may require transfusion; infection which may require antibiotics; injury to uterus leading to risk of injury to surrounding intraperitoneal organs, need for additional procedures including laparoscopy or laparotomy, and other postoperative/anesthesia complications.  Pt also is aware that complete cessation of menses is not guaranteed. Written informed consent was obtained.   FINDINGS:  A 8 week size uterus.  Diffuse proliferative endometrium.  Right sided uterine polyp  Normal ostia bilaterally.  ANESTHESIA:   General  INTRAVENOUS FLUIDS:  1000 ml of LR FLUID DEFICITS:  395 ml of Normal saline ESTIMATED BLOOD LOSS:  Less than 10 ml UOP: 100 ml SPECIMENS: Endometrial curettings sent to pathology COMPLICATIONS:  None immediate  Cavity length: 4.0 Cavity width: 4.5 Power 99 watts Procedure time: 2 minutes  PROCEDURE DETAILS:  The patient was taken to the operating room where general anesthesia was administered and was found to be adequate.  After an adequate timeout was performed, she was placed in the dorsal lithotomy position and examined; then prepped and draped in the sterile manner.   Her bladder was catheterized for 100 ml of clear, yellow urine. A speculum was then placed in the patient's vagina and a single tooth tenaculum was applied to the anterior lip of the cervix.  Uterine sound showed the uterus to be about 8 cm. Diagnostic hysteroscope was placed into the uterine cavity atraumatically after cervical dilation.  Fluffy endometrium was noted along with a prominent right sided endometrial  polyp.  The Myosure device was placed through the hysteroscope and the polyp was removed in its entirety.  Some of the proliferative endometrium was removed as well.     The Suresound device was used to obtain a cavity length   The hysteroscope was removed and a curettage was done to obtain some endometrial curettings.  The cervix was further dilated to accommodate the NovaSure device.  The NovaSure device was inserted, and a cavity width of 4.5  cm was determined. Using a power of 99 watts, for 120 sec, the endometrial ablation was performed.  The tenaculum was removed from the anterior lip of the cervix, and the vaginal speculum was removed after noting good hemostasis.  The patient tolerated the procedure well and was taken to the recovery area awake, extubated and in stable condition.  The patient will be discharged to home as per PACU criteria.  Routine postoperative instructions given.  She was prescribed Percocet,   She will follow up in the clinic in 2 weeks for postoperative evaluation.   Lynnda Shields, MD Faculty Attending, [facility]

## 2020-05-10 NOTE — Discharge Instructions (Signed)
Endometrial Ablation, Care After This sheet gives you information about how to care for yourself after your procedure. Your health care provider may also give you more specific instructions. If you have problems or questions, contact your health care provider. What can I expect after the procedure? After the procedure, it is common to have:  A need to urinate more frequently than usual for the first 24 hours.  Cramps similar to menstrual cramps. These may last for 1-2 days.  Thin, watery vaginal discharge that is light pink or brown in color. This may last a few weeks. Discharge will be heavy for the first few days after your procedure. You may need to wear a sanitary pad.  Nausea.  Vaginal bleeding for 4-6 weeks after the procedure, as tissue healing occurs. Follow these instructions at home: Activity  Do not drive for 24 hours if you were given amedicine to help you relax (sedative) during your procedure.  Do not have sex or put anything into your vagina until your health care provider approves.  Do not lift anything that is heavier than 10 lb (4.5 kg), or the limit that you are told, until your health care provider says that it is safe.  Return to your normal activities as told by your health care provider. Ask your health care provider what activities are safe for you. General instructions   Take over-the-counter and prescription medicines only as told by your health care provider.  Do not take baths, swim, or use a hot tub until your health care provider approves. You will be able to take showers.  Check your vaginal area every day for signs of infection. Check for: ? Redness, swelling, or pain. ? More discharge or blood, instead of less. ? Bad-smelling discharge.  Keep all follow-up visits as told by your health care provider. This is important.  Drink enough fluid to keep your urine pale yellow. Contact a health care provider if you have:  Vaginal redness, swelling, or  pain.  Vaginal discharge or bleeding that gets worse instead of getting better.  Bad-smelling vaginal discharge.  A fever or chills.  Trouble urinating. Get help right away if you have:  Heavy vaginal bleeding.  Severe cramps. Summary  After endometrial ablation, it is normal to have thin, watery vaginal discharge that is light pink or brown in color. This may last a few weeks and may be heavier right after the procedure.  Vaginal bleeding is also normal after the procedure and should get better with time.  Check your vaginal area every day for signs of infection, such as bad-smelling discharge.  Keep all follow-up visits as told by your health care provider. This is important. This information is not intended to replace advice given to you by your health care provider. Make sure you discuss any questions you have with your health care provider. Document Revised: 08/28/2018 Document Reviewed: 03/19/2017 Elsevier Patient Education  [place]. Hysteroscopy, Care After This sheet gives you information about how to care for yourself after your procedure. Your health care provider may also give you more specific instructions. If you have problems or questions, contact your health care provider. What can I expect after the procedure? After the procedure, it is common to have:  Cramping.  Bleeding. This can vary from light spotting to menstrual-like bleeding. Follow these instructions at home: Activity  Rest for 1-2 days after the procedure.  Do not douche, use tampons, or have sex for 2 weeks after the procedure, or  until your health care provider approves.  Do not drive for 24 hours after the procedure, or for as long as told by your health care provider.  Do not drive, use heavy machinery, or drink alcohol while taking prescription pain medicines. Medicines   Take over-the-counter and prescription medicines only as told by your health care provider.  Do not take  aspirin during recovery. It can increase the risk of bleeding. General instructions  Do not take baths, swim, or use a hot tub until your health care provider approves. Take showers instead of baths for 2 weeks, or for as long as told by your health care provider.  To prevent or treat constipation while you are taking prescription pain medicine, your health care provider may recommend that you: ? Drink enough fluid to keep your urine clear or pale yellow. ? Take over-the-counter or prescription medicines. ? Eat foods that are high in fiber, such as fresh fruits and vegetables, whole grains, and beans. ? Limit foods that are high in fat and processed sugars, such as fried and sweet foods.  Keep all follow-up visits as told by your health care provider. This is important. Contact a health care provider if:  You feel dizzy or lightheaded.  You feel nauseous.  You have abnormal vaginal discharge.  You have a rash.  You have pain that does not get better with medicine.  You have chills. Get help right away if:  You have bleeding that is heavier than a normal menstrual period.  You have a fever.  You have pain or cramps that get worse.  You develop new abdominal pain.  You faint.  You have pain in your shoulders.  You have shortness of breath. Summary  After the procedure, you may have cramping and some vaginal bleeding.  Do not douche, use tampons, or have sex for 2 weeks after the procedure, or until your health care provider approves.  Do not take baths, swim, or use a hot tub until your health care provider approves. Take showers instead of baths for 2 weeks, or for as long as told by your health care provider.  Report any unusual symptoms to your health care provider.  Keep all follow-up visits as told by your health care provider. This is important. This information is not intended to replace advice given to you by your health care provider. Make sure you discuss  any questions you have with your health care provider. Document Revised: 04/19/2017 Document Reviewed: 06/05/2016 Elsevier Patient Education  [place].

## 2020-05-10 NOTE — Anesthesia Procedure Notes (Signed)
Procedure Name: LMA Insertion Date/Time: 05/10/2020 8:26 AM Performed by: Claudia Desanctis, CRNA Pre-anesthesia Checklist: Emergency Drugs available, Patient identified, Suction available and Patient being monitored Patient Re-evaluated:Patient Re-evaluated prior to induction Oxygen Delivery Method: Circle system utilized Preoxygenation: Pre-oxygenation with 100% oxygen Ventilation: Mask ventilation without difficulty LMA: LMA with gastric port inserted LMA Size: 4.0 Number of attempts: 1 Placement Confirmation: positive ETCO2 and breath sounds checked- equal and bilateral Tube secured with: Tape Dental Injury: Teeth and Oropharynx as per pre-operative assessment

## 2020-05-10 NOTE — H&P (Signed)
OB/GYN Pre-Op History and Physical  Meghan Dawson is a 49 y.o. [phone] presenting for hysteroscopy dilation and curettage with novasure uterine ablation.  We have discussed the procedure in detail and she understands the expected outcome is a decrease in duration and severity of her menses.  She may even accomplish amenorrhea.  Her ultrasound was reviewed as was her endometrial biopsy. Endometrial biopsy showed normal endometrium. Risks and benefits were given tot he patient including bleeding, infection, involvement of other organs as well as uterine perforation and post ablation syndrome.   Pt also was told she would have a light tan discharge the first few months after the procedure.      Past Medical History:  Diagnosis Date  . Allergy   . Blood transfusion without reported diagnosis 03/2019  . Headache   . Iron deficiency anemia   . LBBB (left bundle branch block) 2020  . Menorrhagia   . Vitamin B 12 deficiency     Past Surgical History:  Procedure Laterality Date  . APPENDECTOMY     age 28  . BREAST SURGERY Right    blocked milk duct  . CATARACT EXTRACTION, BILATERAL    . CYST EXCISION     Forehead  . TUBAL LIGATION  1998    OB History  Gravida Para Term Preterm AB Living  4 3 2 1 1 3   SAB IAB Ectopic Multiple Live Births  1       3    # Outcome Date GA Lbr Len/2nd Weight Sex Delivery Anes PTL Lv  4 Term 11/04/96    M Vag-Spont   LIV  3 Term 10/22/95    M Vag-Spont   LIV  2 Preterm 06/03/90    F Vag-Spont   LIV  1 SAB             Social History   Socioeconomic History  . Marital status: Married    Spouse name: Not on file  . Number of children: Not on file  . Years of education: Not on file  . Highest education level: Not on file  Occupational History  . Not on file  Tobacco Use  . Smoking status: Never Smoker  . Smokeless tobacco: Never Used  Vaping Use  . Vaping Use: Never used  Substance and Sexual Activity  . Alcohol use: Yes    Comment: occ  .  Drug use: Never  . Sexual activity: Yes    Partners: Male    Birth control/protection: Surgical  Other Topics Concern  . Not on file  Social History Narrative  . Not on file   Social Determinants of Health   Financial Resource Strain: Not on file  Food Insecurity: Not on file  Transportation Needs: Not on file  Physical Activity: Not on file  Stress: Not on file  Social Connections: Not on file    Family History  Problem Relation Age of Onset  . Hypertension Mother   . Miscarriages / [place] Mother   . Hyperlipidemia Mother   . Diabetes Mother   . Hypertension Father   . Heart disease Father   . Diabetes Father   . Hypertension Sister   . Miscarriages / Stillbirths Sister   . Diabetes Sister   . Hypertension Brother   . Hypertension Maternal Grandmother   . Hypertension Maternal Grandfather     Medications Prior to Admission  Medication Sig Dispense Refill Last Dose  . acetaminophen (TYLENOL) 500 MG tablet Take 1,000 mg by mouth  every 6 (six) hours as needed for moderate pain or headache.   Past Week at Unknown time  . BIOTIN PO Take 3 tablets by mouth daily.   05/09/2020 at Unknown time  . ESTROGENS CONJ SYNTHETIC B PO Take 2 tablets by mouth every morning. Otc supplement   Past Month at Unknown time  . ferrous sulfate 325 (65 FE) MG tablet Take 325 mg by mouth daily with breakfast.   Past Month at Unknown time  . Multiple Vitamins-Minerals (MULTIPLE VITAMINS/WOMENS PO) Take 2 tablets by mouth daily.   05/09/2020 at Unknown time  . Omega-3 Fatty Acids (FISH OIL) 1000 MG CPDR Take 1,000 mg by mouth daily.   05/09/2020 at Unknown time  . vitamin C (ASCORBIC ACID) 250 MG tablet Take 250 mg by mouth daily.   05/09/2020 at Unknown time    Allergies  Allergen Reactions  . Aspirin Hives and Swelling    Review of Systems: Negative except for what is mentioned in HPI.     Physical Exam: BP 122/84   Pulse 77   Temp 97.7 F (36.5 C) (Oral)   Resp 16   Ht 5\' 9"   (1.753 m)   Wt 87 kg   LMP 04/30/2020 (Exact Date) Comment: urine preg.pending,05/10/20  SpO2 98%   BMI 28.32 kg/m  CONSTITUTIONAL: Well-developed, well-nourished female in no acute distress.  HENT:  Normocephalic, atraumatic, External right and left ear normal. Oropharynx is clear and moist EYES: Conjunctivae and EOM are normal.  NECK: Normal range of motion, supple, no masses SKIN: Skin is warm and dry. No rash noted. Not diaphoretic. No erythema. No pallor. [place]: Alert and oriented to person, place, and time. Normal reflexes, muscle tone coordination. No cranial nerve deficit noted. PSYCHIATRIC: Normal mood and affect. Normal behavior. Normal judgment and thought content. CARDIOVASCULAR: Normal heart rate noted, regular rhythm RESPIRATORY: Effort and breath sounds normal, no problems with respiration noted ABDOMEN: Soft, nontender, nondistended, gravid. PELVIC: Deferred MUSCULOSKELETAL: Normal range of motion. No edema and no tenderness. 2+ distal pulses.   Pertinent Labs/Studies:   Results for orders placed or performed during the hospital encounter of 05/10/20 (from the past 72 hour(s))  Pregnancy, urine     Status: None   Collection Time: 05/10/20  6:29 AM  Result Value Ref Range   Preg Test, Ur NEGATIVE NEGATIVE    Comment:        THE SENSITIVITY OF THIS METHODOLOGY IS >20 mIU/mL. Performed at [facility], [place] [address], [place], [place] [address]   CBC     Status: Abnormal   Collection Time: 05/10/20  7:05 AM  Result Value Ref Range   WBC 5.4 4.0 - 10.5 K/uL   RBC 4.57 3.87 - 5.11 MIL/uL   Hemoglobin 11.2 (L) 12.0 - 15.0 g/dL   HCT 34.4 (L) 36.0 - 46.0 %   MCV 75.3 (L) 80.0 - 100.0 fL   MCH 24.5 (L) 26.0 - 34.0 pg   MCHC 32.6 30.0 - 36.0 g/dL   RDW 17.7 (H) 11.5 - 15.5 %   Platelets 272 150 - 400 K/uL   nRBC 0.0 0.0 - 0.2 %    Comment: Performed at [facility], [place] [address] [place], [place] [address]    CLINICAL  DATA:  Menorrhagia, irregular cycle, LMP 03/25/2020; anemia; prior tubal ligation   EXAM: TRANSABDOMINAL AND TRANSVAGINAL ULTRASOUND OF PELVIS   TECHNIQUE: Both transabdominal and transvaginal ultrasound examinations of the pelvis were performed. Transabdominal technique was performed for  global imaging of the pelvis including uterus, ovaries, adnexal regions, and pelvic cul-de-sac. It was necessary to proceed with endovaginal exam following the transabdominal exam to visualize the cervix/lower uterine segment and RIGHT ovary.   COMPARISON:  None   FINDINGS: Uterus   Measurements: 7.7 x 4.5 x 5.8 cm = volume: 104 mL. Anteverted. Focal mass at RIGHT fundus consistent with subserosal leiomyoma 3.0 x 2.5 x 2.5 cm. No additional masses.   Endometrium   Thickness: 6 mm. Small hyperechoic lenticular nodule identified at upper uterine segment endometrial canal to the RIGHT, 9 x 4 x 7 mm, cannot exclude polyp. No endometrial fluid.   Right ovary   Measurements: 2.7 x 1.1 x 2.5 cm = volume: 3.8 mL. Normal morphology without mass   Left ovary   Measurements: 3.1 x 1.9 x 1.9 cm = volume: 5.5 mL. Normal morphology without mass   Other findings   No free pelvic fluid.  No adnexal masses.   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   9 x 4 x 7 mm hyperechoic lenticular nodule at upper uterine segment endometrial canal to the RIGHT, cannot exclude endometrial polyp; consider sonohysterogram for further evaluation, prior to hysteroscopy or endometrial biopsy.     Assessment and Plan :Meghan Dawson is a 49 y.o. [phone] here for hysteroscopy, dilation and curettage with novasure uterine ablation due to menorrhagia.  Risks and benefits of the procedure given including bleeding, infection, involvement of other organs, uterine perforation and post ablation syndrome.  Consent signed and patient wishes to proceed.  NPO Admission labs ordered  Lynnda Shields,  M.D. Attending [place], [facility] for [employer], [place]

## 2020-05-10 NOTE — Transfer of Care (Signed)
Immediate Anesthesia Transfer of Care Note  Patient: Meghan Dawson  Procedure(s) Performed: DILATATION & CURETTAGE/HYSTEROSCOPY WITH NOVASURE ABLATION (N/A Vagina )  Patient Location: PACU  Anesthesia Type:General  Level of Consciousness: awake, alert , oriented and patient cooperative  Airway & Oxygen Therapy: Patient Spontanous Breathing and Patient connected to face mask  Post-op Assessment: Report given to RN and Post -op Vital signs reviewed and stable  Post vital signs: Reviewed and stable  Last Vitals:  Vitals Value Taken Time  BP 118/74 05/10/20 0933  Temp    Pulse 89 05/10/20 0933  Resp 14 05/10/20 0933  SpO2 100 % 05/10/20 0933  Vitals shown include unvalidated device data.  Last Pain:  Vitals:   05/10/20 0655  TempSrc: Oral  PainSc:       Patients Stated Pain Goal: 3 (31/51/76 1607)  Complications: No complications documented.

## 2020-05-11 ENCOUNTER — Encounter (HOSPITAL_COMMUNITY): Payer: Self-pay | Admitting: Obstetrics and Gynecology

## 2020-05-11 LAB — SURGICAL PATHOLOGY

## 2020-06-01 ENCOUNTER — Ambulatory Visit (INDEPENDENT_AMBULATORY_CARE_PROVIDER_SITE_OTHER): Payer: 59 | Admitting: Obstetrics and Gynecology

## 2020-06-01 ENCOUNTER — Encounter: Payer: 59 | Admitting: Obstetrics and Gynecology

## 2020-06-01 ENCOUNTER — Other Ambulatory Visit: Payer: Self-pay

## 2020-06-01 DIAGNOSIS — Z4889 Encounter for other specified surgical aftercare: Secondary | ICD-10-CM | POA: Insufficient documentation

## 2020-06-01 NOTE — Progress Notes (Signed)
Pt states she is doing well. Bleeding is light and mild cramping.  Pt would like to know when she can douche and use tampons.

## 2020-06-01 NOTE — Progress Notes (Signed)
    Subjective:    Meghan Dawson is a 50 y.o. female who presents to the clinic status post hysteroscopy dilation and curettage with Novasure uterine ablation on 05/10/2020. The patient is not having any pain.  Eating a regular diet without difficulty. Bowel movements are normal. No other significant postoperative concerns.  Pt states she has had one menses since the procedure, and she states it was much lighter.  Menses lasted 7 days, but she no longer needs pads.  Pain/cramping was also much decreased.  She is happy with the results of the procedure.    The following portions of the patient's history were reviewed and updated as appropriate: allergies, current medications, past family history, past medical history, past social history, past surgical history and problem list..  Last pap smear was  on 03/09/20.  Review of Systems Pertinent items are noted in HPI.   Objective:   BP 107/71   Pulse 91   Wt 196 lb (88.9 kg)   BMI 28.94 kg/m  Constitutional:  Well-developed, well-nourished female in no acute distress.   Skin: Skin is warm and dry, no rash noted, not diaphoretic,no erythema, no pallor.  Cardiovascular: Normal heart rate noted  Respiratory: Effort and breath sounds normal, no problems with respiration noted  Abdomen: Soft, bowel sounds active, non-tender, no abnormal masses  Incision: Healing well, no drainage, no erythema, no hernia, no seroma, no swelling, no dehiscence, incision well approximated  Pelvic:   SVE:  cvx WNL, small amount of thin, tan colored discharge   Surgical pathology (benign endometrial curettings, benign endometrial polyp)  Assessment:   Doing well postoperatively.  Operative findings again reviewed. Pathology report discussed.   Plan:   1. Continue any current medications. 2. Wound care discussed. 3. Activity restrictions: none 4. Anticipated return to work: now. 5. Follow up 6 months with virtual visit for continued evaluation 6.  Routine  preventative health maintenance measures emphasized. Please refer to After Visit Summary for other counseling recommendations.    Lynnda Shields, MD, [place] Attending [place] for [facility], [place]

## 2020-06-30 ENCOUNTER — Other Ambulatory Visit: Payer: Self-pay | Admitting: Obstetrics and Gynecology

## 2020-06-30 DIAGNOSIS — Z1231 Encounter for screening mammogram for malignant neoplasm of breast: Secondary | ICD-10-CM

## 2020-07-21 ENCOUNTER — Other Ambulatory Visit: Payer: Self-pay

## 2020-07-21 ENCOUNTER — Ambulatory Visit
Admission: RE | Admit: 2020-07-21 | Discharge: 2020-07-21 | Disposition: A | Discharge status: Home / Self Care | Payer: 59 | Source: Ambulatory Visit | Attending: Obstetrics and Gynecology | Admitting: Obstetrics and Gynecology

## 2020-07-21 DIAGNOSIS — Z1231 Encounter for screening mammogram for malignant neoplasm of breast: Secondary | ICD-10-CM

## 2020-07-21 LAB — HM MAMMOGRAPHY

## 2020-08-01 ENCOUNTER — Other Ambulatory Visit: Payer: Self-pay

## 2020-08-01 DIAGNOSIS — N6489 Other specified disorders of breast: Secondary | ICD-10-CM

## 2020-08-02 ENCOUNTER — Other Ambulatory Visit: Payer: Self-pay | Admitting: Obstetrics and Gynecology

## 2020-08-02 DIAGNOSIS — R928 Other abnormal and inconclusive findings on diagnostic imaging of breast: Secondary | ICD-10-CM

## 2020-08-17 ENCOUNTER — Ambulatory Visit
Admission: RE | Admit: 2020-08-17 | Discharge: 2020-08-17 | Disposition: A | Discharge status: Home / Self Care | Payer: 59 | Source: Ambulatory Visit | Attending: Obstetrics and Gynecology | Admitting: Obstetrics and Gynecology

## 2020-08-17 ENCOUNTER — Other Ambulatory Visit: Payer: Self-pay

## 2020-08-17 ENCOUNTER — Ambulatory Visit: Admission: RE | Admit: 2020-08-17 | Payer: 59 | Source: Ambulatory Visit

## 2020-08-17 DIAGNOSIS — R928 Other abnormal and inconclusive findings on diagnostic imaging of breast: Secondary | ICD-10-CM

## 2020-12-06 ENCOUNTER — Ambulatory Visit: Payer: 59 | Admitting: Obstetrics and Gynecology

## 2020-12-09 ENCOUNTER — Ambulatory Visit: Payer: 59 | Admitting: Obstetrics

## 2021-02-20 ENCOUNTER — Encounter: Payer: Self-pay | Admitting: Hematology and Oncology

## 2021-02-20 ENCOUNTER — Encounter: Payer: Self-pay | Admitting: Emergency Medicine

## 2021-02-20 ENCOUNTER — Ambulatory Visit (INDEPENDENT_AMBULATORY_CARE_PROVIDER_SITE_OTHER): Payer: 59 | Admitting: Emergency Medicine

## 2021-02-20 ENCOUNTER — Other Ambulatory Visit: Payer: Self-pay

## 2021-02-20 VITALS — BP 118/80 | HR 88 | Temp 98.9°F | Ht 69.0 in | Wt 187.0 lb

## 2021-02-20 DIAGNOSIS — Z1211 Encounter for screening for malignant neoplasm of colon: Secondary | ICD-10-CM | POA: Diagnosis not present

## 2021-02-20 DIAGNOSIS — Z7689 Persons encountering health services in other specified circumstances: Secondary | ICD-10-CM

## 2021-02-20 DIAGNOSIS — D509 Iron deficiency anemia, unspecified: Secondary | ICD-10-CM

## 2021-02-20 DIAGNOSIS — Z23 Encounter for immunization: Secondary | ICD-10-CM

## 2021-02-20 LAB — HEMOGLOBIN A1C: Hgb A1c MFr Bld: 5.6 % (ref 4.6–6.5)

## 2021-02-20 LAB — CBC WITH DIFFERENTIAL/PLATELET
Basophils Absolute: 0 10*3/uL (ref 0.0–0.1)
Basophils Relative: 0.6 % (ref 0.0–3.0)
Eosinophils Absolute: 0.2 10*3/uL (ref 0.0–0.7)
Eosinophils Relative: 2.9 % (ref 0.0–5.0)
HCT: 40.8 % (ref 36.0–46.0)
Hemoglobin: 13.7 g/dL (ref 12.0–15.0)
Lymphocytes Relative: 37.9 % (ref 12.0–46.0)
Lymphs Abs: 2.1 10*3/uL (ref 0.7–4.0)
MCHC: 33.5 g/dL (ref 30.0–36.0)
MCV: 81 fl (ref 78.0–100.0)
Monocytes Absolute: 0.6 10*3/uL (ref 0.1–1.0)
Monocytes Relative: 10.1 % (ref 3.0–12.0)
Neutro Abs: 2.7 10*3/uL (ref 1.4–7.7)
Neutrophils Relative %: 48.5 % (ref 43.0–77.0)
Platelets: 254 10*3/uL (ref 150.0–400.0)
RBC: 5.04 Mil/uL (ref 3.87–5.11)
RDW: 16.1 % — ABNORMAL HIGH (ref 11.5–15.5)
WBC: 5.6 10*3/uL (ref 4.0–10.5)

## 2021-02-20 LAB — LIPID PANEL
Cholesterol: 193 mg/dL (ref 0–200)
HDL: 54.7 mg/dL (ref >39.00)
LDL Cholesterol: 106 mg/dL — ABNORMAL HIGH (ref 0–99)
NonHDL: 138.61
Total CHOL/HDL Ratio: 4
Triglycerides: 163 mg/dL — ABNORMAL HIGH (ref 0.0–149.0)
VLDL: 32.6 mg/dL (ref 0.0–40.0)

## 2021-02-20 LAB — COMPREHENSIVE METABOLIC PANEL
ALT: 18 U/L (ref 0–35)
AST: 17 U/L (ref 0–37)
Albumin: 4.3 g/dL (ref 3.5–5.2)
Alkaline Phosphatase: 68 U/L (ref 39–117)
BUN: 13 mg/dL (ref 6–23)
CO2: 26 mEq/L (ref 19–32)
Calcium: 9.4 mg/dL (ref 8.4–10.5)
Chloride: 105 mEq/L (ref 96–112)
Creatinine, Ser: 0.7 mg/dL (ref 0.40–1.20)
GFR: 101.67 mL/min (ref >60.00)
Glucose, Bld: 83 mg/dL (ref 70–99)
Potassium: 4 mEq/L (ref 3.5–5.1)
Sodium: 139 mEq/L (ref 135–145)
Total Bilirubin: 0.4 mg/dL (ref 0.2–1.2)
Total Protein: 7.2 g/dL (ref 6.0–8.3)

## 2021-02-20 LAB — TSH: TSH: 1.28 u[IU]/mL (ref 0.35–5.50)

## 2021-02-20 NOTE — Progress Notes (Signed)
Meghan Dawson 50 y.o.   Chief Complaint  Patient presents with   New Patient (Initial Visit)    HISTORY OF PRESENT ILLNESS: This is a 50 y.o. female first visit to this office, here to establish care with me. Has history of chronic iron deficiency anemia.  Has seen hematologist in the past.  Last visit April 2021.  IV iron transfusions recommended but patient remains undecided.  Patient has history of menorrhagia with irregular menstrual cycles.  Status post D&C with endometrial biopsy last December.  States procedure helped with her chronic blood loss.  Takes daily iron supplement. No other chronic medical problems. No other complaints or medical concerns today.  HPI   Prior to Admission medications   Medication Sig Start Date End Date Taking? Authorizing Provider  BIOTIN PO Take 3 tablets by mouth daily.   Yes [provider]  ferrous sulfate 325 (65 FE) MG tablet Take 325 mg by mouth daily with breakfast.   Yes [provider]  Multiple Vitamins-Minerals (MULTIPLE VITAMINS/WOMENS PO) Take 2 tablets by mouth daily.   Yes [provider]  Omega-3 Fatty Acids (FISH OIL) 1000 MG CPDR Take 1,000 mg by mouth daily.   Yes [provider]  vitamin C (ASCORBIC ACID) 250 MG tablet Take 250 mg by mouth daily.   Yes [provider]    Allergies  Allergen Reactions   Aspirin Hives and Swelling    Patient Active Problem List   Diagnosis Date Noted   Iron deficiency anemia 12/13/2017    Past Medical History:  Diagnosis Date   Allergy    Blood transfusion without reported diagnosis 03/2019   Headache    Iron deficiency anemia    LBBB (left bundle branch block) 2020   Menorrhagia    Vitamin B 12 deficiency     Past Surgical History:  Procedure Laterality Date   APPENDECTOMY     age 69   BREAST BIOPSY     BREAST SURGERY Right    blocked milk duct   CATARACT EXTRACTION, BILATERAL     CYST EXCISION     Forehead   DILITATION &  CURRETTAGE/HYSTROSCOPY WITH NOVASURE ABLATION N/A 05/10/2020   Procedure: DILATATION & CURETTAGE/HYSTEROSCOPY WITH NOVASURE ABLATION;  Surgeon: Griffin Basil, MD;  Location: [facility];  Service: Gynecology;  Laterality: N/A;   TUBAL LIGATION  1998    Social History   Socioeconomic History   Marital status: Married    Spouse name: Not on file   Number of children: Not on file   Years of education: Not on file   Highest education level: Not on file  Occupational History   Not on file  Tobacco Use   Smoking status: Never   Smokeless tobacco: Never  Vaping Use   Vaping Use: Never used  Substance and Sexual Activity   Alcohol use: Yes    Comment: occ   Drug use: Never   Sexual activity: Yes    Partners: Male    Birth control/protection: Surgical  Other Topics Concern   Not on file  Social History Narrative   Not on file   Social Determinants of Health   Financial Resource Strain: Not on file  Food Insecurity: Not on file  Transportation Needs: Not on file  Physical Activity: Not on file  Stress: Not on file  Social Connections: Not on file  Intimate Partner Violence: Not on file    Family History  Problem Relation Age of Onset   Hypertension  Mother    Miscarriages / Stillbirths Mother    Hyperlipidemia Mother    Diabetes Mother    Hypertension Father    Heart disease Father    Diabetes Father    Hypertension Sister    Miscarriages / Stillbirths Sister    Diabetes Sister    Hypertension Brother    Hypertension Maternal Grandmother    Hypertension Maternal Grandfather    Breast cancer Maternal Aunt      Review of Systems  Constitutional: Negative.  Negative for chills and fever.  HENT: Negative.  Negative for congestion and sore throat.   Respiratory: Negative.  Negative for cough and shortness of breath.   Cardiovascular:  Negative for chest pain and palpitations.  Gastrointestinal: Negative.  Negative for abdominal pain, diarrhea, nausea and vomiting.   Genitourinary: Negative.  Negative for dysuria.  Skin: Negative.  Negative for rash.  Neurological: Negative.  Negative for dizziness and headaches.  All other systems reviewed and are negative. Today's Vitals   02/20/21 1040  BP: 118/80  Pulse: 88  Temp: 98.9 F (37.2 C)  TempSrc: Oral  SpO2: 96%  Weight: 187 lb (84.8 kg)  Height: 5\' 9"  (1.753 m)   Body mass index is 27.62 kg/m.   Physical Exam Vitals reviewed.  Constitutional:      Appearance: Normal appearance.  HENT:     Head: Normocephalic.     Mouth/Throat:     Mouth: Mucous membranes are moist.     Pharynx: Oropharynx is clear.  Eyes:     Extraocular Movements: Extraocular movements intact.     Conjunctiva/sclera: Conjunctivae normal.     Pupils: Pupils are equal, round, and reactive to light.  Cardiovascular:     Rate and Rhythm: Normal rate and regular rhythm.     Pulses: Normal pulses.     Heart sounds: Normal heart sounds.  Pulmonary:     Effort: Pulmonary effort is normal.     Breath sounds: Normal breath sounds.  Musculoskeletal:        General: Normal range of motion.     Cervical back: Normal range of motion and neck supple. No tenderness.  Lymphadenopathy:     Cervical: No cervical adenopathy.  Skin:    General: Skin is warm and dry.     Capillary Refill: Capillary refill takes less than 2 seconds.  Neurological:     General: No focal deficit present.     Mental Status: She is alert and oriented to person, place, and time.  Psychiatric:        Mood and Affect: Mood normal.        Behavior: Behavior normal.     ASSESSMENT & PLAN: Problem List Items Addressed This Visit       Other   Chronic iron deficiency anemia - Primary    Stable and asymptomatic.  Stable vital signs.  Blood work done today. Advised to follow-up with hematologist and reconsider IV iron infusion treatment. Continue daily iron supplementation. Follow-up in 6 months.      Relevant Orders   CBC with  Differential/Platelet   Comprehensive metabolic panel   Hemoglobin A1c   Lipid panel   TSH   Other Visit Diagnoses     Need for influenza vaccination       Relevant Orders   Flu Vaccine QUAD 36mo+IM (Fluarix, Fluzone & Alfiuria Quad PF) (Completed)   Colon cancer screening       Relevant Orders   Ambulatory referral to Gastroenterology   Encounter  to establish care          Patient Instructions  Iron Deficiency Anemia, Adult Iron deficiency anemia is when you do not have enough red blood cells or hemoglobin in your blood. This happens because you have too little iron in your body. Hemoglobin carries oxygen to parts of the body. Anemia can cause your body to not get enough oxygen. What are the causes? Not eating enough foods that have iron in them. The body not being able to take in iron well. Needing more iron due to pregnancy or heavy menstrual periods, for females. Cancer. Bleeding in the bowels. Many blood draws. What increases the risk? Being pregnant. Being a teenage girl going through a growth spurt. What are the signs or symptoms? Pale skin, lips, and nails. Weakness, dizziness, and getting tired easily. Headache. Feeling like you cannot breathe well when moving (shortness of breath). Cold hands and feet. Fast heartbeat or a heartbeat that is not regular. Feeling grouchy (irritable) or breathing fast. These are more common in very bad anemia. Mild anemia may not cause any symptoms. How is this treated? This condition is treated by finding out why you do not have enough iron and then getting more iron. It may include: Adding foods to your diet that have a lot of iron. Taking iron pills (supplements). If you are pregnant or breastfeeding, you may need to take extra iron. Your diet often does not provide the amount of iron that you need. Getting more vitamin C in your diet. Vitamin C helps your body take in iron. You may need to take iron pills with a glass of orange  juice or vitamin C pills. Medicines to make heavy menstrual periods lighter. Surgery. You may need blood tests to see if treatment is working. If the treatment does not seem to be working, you may need more tests. Follow these instructions at home: Medicines Take over-the-counter and prescription medicines only as told by your doctor. This includes iron pills and vitamins. Take iron pills when your stomach is empty. If you cannot handle this, take them with food. Do not drink milk or take antacids at the same time as your iron pills. Iron pills may turn your poop (stool)black. If you cannot handle taking iron pills by mouth, ask your doctor about getting iron through: An IV tube. A shot (injection) into a muscle. Eating and drinking  Talk with your doctor before changing the foods you eat. He or she may tell you to eat foods that have a lot of iron, such as: Liver. Low-fat (lean) beef. Breads and cereals that have iron added to them. Eggs. Dried fruit. Dark green, leafy vegetables. Eat fresh fruits and vegetables that are high in vitamin C. They help your body use iron. Foods with a lot of vitamin C include: Oranges. Peppers. Tomatoes. Mangoes. Drink enough fluid to keep your pee (urine) pale yellow. Managing constipation If you are taking iron pills, they may cause trouble pooping (constipation). To prevent or treat trouble pooping, you may need to: Take over-the-counter or prescription medicines. Eat foods that are high in fiber. These include beans, whole grains, and fresh fruits and vegetables. Limit foods that are high in fat and sugar. These include fried or sweet foods. General instructions Return to your normal activities as told by your doctor. Ask your doctor what activities are safe for you. Keep yourself clean, and keep things clean around you. Keep all follow-up visits as told by your doctor. This is important.  Contact a doctor if: You feel like you may vomit  (nauseous), or you vomit. You feel weak. You are sweating for no reason. You have trouble pooping, such as: Pooping less than 3 times a week. Straining to poop. Having poop that is hard, dry, or larger than normal. Feeling full or bloated. Pain in the lower belly. Not feeling better after pooping. Get help right away if: You pass out (faint). You have chest pain. You have trouble breathing that: Is very bad. Gets worse with physical activity. You have a fast heartbeat, or a heartbeat that does not feel regular. You get light-headed when getting up from sitting or lying down. These symptoms may be an emergency. Do not wait to see if the symptoms will go away. Get medical help right away. Call your local emergency services (911 in the [place] Do not drive yourself to the hospital. Summary Iron deficiency anemia is when you have too little iron in your body. This condition is treated by finding out why you do not have enough iron in your body and then getting more iron. Take over-the-counter and prescription medicines only as told by your doctor. Eat fresh fruits and vegetables that are high in vitamin C. Get help right away if you cannot breathe well. This information is not intended to replace advice given to you by your health care provider. Make sure you discuss any questions you have with your health care provider. Document Revised: 01/13/2019 Document Reviewed: 01/13/2019 Elsevier Patient Education  2022 [place], [place] [facility]

## 2021-02-20 NOTE — Assessment & Plan Note (Signed)
Stable and asymptomatic.  Stable vital signs.  Blood work done today. Advised to follow-up with hematologist and reconsider IV iron infusion treatment. Continue daily iron supplementation. Follow-up in 6 months.

## 2021-02-20 NOTE — Patient Instructions (Signed)
Iron Deficiency Anemia, Adult Iron deficiency anemia is when you do not have enough red blood cells or hemoglobin in your blood. This happens because you have too little iron in your body. Hemoglobin carries oxygen to parts of the body. Anemia can cause yourbody to not get enough oxygen. What are the causes? Not eating enough foods that have iron in them. The body not being able to take in iron well. Needing more iron due to pregnancy or heavy menstrual periods, for females. Cancer. Bleeding in the bowels. Many blood draws. What increases the risk? Being pregnant. Being a teenage girl going through a growth spurt. What are the signs or symptoms? Pale skin, lips, and nails. Weakness, dizziness, and getting tired easily. Headache. Feeling like you cannot breathe well when moving (shortness of breath). Cold hands and feet. Fast heartbeat or a heartbeat that is not regular. Feeling grouchy (irritable) or breathing fast. These are more common in very bad anemia. Mild anemia may not cause any symptoms. How is this treated? This condition is treated by finding out why you do not have enough iron and then getting more iron. It may include: Adding foods to your diet that have a lot of iron. Taking iron pills (supplements). If you are pregnant or breastfeeding, you may need to take extra iron. Your diet often does not provide the amount of iron that you need. Getting more vitamin C in your diet. Vitamin C helps your body take in iron. You may need to take iron pills with a glass of orange juice or vitamin C pills. Medicines to make heavy menstrual periods lighter. Surgery. You may need blood tests to see if treatment is working. If the treatment doesnot seem to be working, you may need more tests. Follow these instructions at home: Medicines Take over-the-counter and prescription medicines only as told by your doctor. This includes iron pills and vitamins. Take iron pills when your stomach is  empty. If you cannot handle this, take them with food. Do not drink milk or take antacids at the same time as your iron pills. Iron pills may turn your poop (stool)black. If you cannot handle taking iron pills by mouth, ask your doctor about getting iron through: An IV tube. A shot (injection) into a muscle. Eating and drinking  Talk with your doctor before changing the foods you eat. He or she may tell you to eat foods that have a lot of iron, such as: Liver. Low-fat (lean) beef. Breads and cereals that have iron added to them. Eggs. Dried fruit. Dark green, leafy vegetables. Eat fresh fruits and vegetables that are high in vitamin C. They help your body use iron. Foods with a lot of vitamin C include: Oranges. Peppers. Tomatoes. Mangoes. Drink enough fluid to keep your pee (urine) pale yellow.  Managing constipation If you are taking iron pills, they may cause trouble pooping (constipation). To prevent or treat trouble pooping, you may need to: Take over-the-counter or prescription medicines. Eat foods that are high in fiber. These include beans, whole grains, and fresh fruits and vegetables. Limit foods that are high in fat and sugar. These include fried or sweet foods. General instructions Return to your normal activities as told by your doctor. Ask your doctor what activities are safe for you. Keep yourself clean, and keep things clean around you. Keep all follow-up visits as told by your doctor. This is important. Contact a doctor if: You feel like you may vomit (nauseous), or you vomit. You feel   weak. You are sweating for no reason. You have trouble pooping, such as: Pooping less than 3 times a week. Straining to poop. Having poop that is hard, dry, or larger than normal. Feeling full or bloated. Pain in the lower belly. Not feeling better after pooping. Get help right away if: You pass out (faint). You have chest pain. You have trouble breathing that: Is very  bad. Gets worse with physical activity. You have a fast heartbeat, or a heartbeat that does not feel regular. You get light-headed when getting up from sitting or lying down. These symptoms may be an emergency. Do not wait to see if the symptoms will go away. Get medical help right away. Call your local emergency services (911 in the [place] Do not drive yourself to the hospital. Summary Iron deficiency anemia is when you have too little iron in your body. This condition is treated by finding out why you do not have enough iron in your body and then getting more iron. Take over-the-counter and prescription medicines only as told by your doctor. Eat fresh fruits and vegetables that are high in vitamin C. Get help right away if you cannot breathe well. This information is not intended to replace advice given to you by your health care provider. Make sure you discuss any questions you have with your healthcare provider. Document Revised: 01/13/2019 Document Reviewed: 01/13/2019 Elsevier Patient Education  2022 [employer]vier Inc[employer]  

## 2021-03-22 ENCOUNTER — Telehealth: Payer: Self-pay | Admitting: *Deleted

## 2021-03-28 ENCOUNTER — Encounter: Payer: Self-pay | Admitting: Gastroenterology

## 2021-04-06 ENCOUNTER — Encounter: Payer: 59 | Admitting: Gastroenterology

## 2021-04-10 NOTE — Telephone Encounter (Signed)
Patient rescheduled

## 2021-04-24 ENCOUNTER — Encounter: Payer: Self-pay | Admitting: Nurse Practitioner

## 2021-04-24 ENCOUNTER — Telehealth (INDEPENDENT_AMBULATORY_CARE_PROVIDER_SITE_OTHER): Payer: 59 | Admitting: Nurse Practitioner

## 2021-04-24 ENCOUNTER — Telehealth: Payer: Self-pay | Admitting: Hematology and Oncology

## 2021-04-24 ENCOUNTER — Other Ambulatory Visit: Payer: Self-pay | Admitting: Emergency Medicine

## 2021-04-24 ENCOUNTER — Other Ambulatory Visit: Payer: Self-pay

## 2021-04-24 ENCOUNTER — Telehealth: Payer: Self-pay

## 2021-04-24 VITALS — Temp 98.2°F

## 2021-04-24 DIAGNOSIS — U071 COVID-19: Secondary | ICD-10-CM

## 2021-04-24 DIAGNOSIS — D509 Iron deficiency anemia, unspecified: Secondary | ICD-10-CM

## 2021-04-24 MED ORDER — NIRMATRELVIR/RITONAVIR (PAXLOVID)TABLET: 3.0000 | ORAL_TABLET | Freq: Two times a day (BID) | ORAL | 0 refills | Status: AC

## 2021-04-24 NOTE — Telephone Encounter (Signed)
Scheduled appt oper 12/5 referral. Established pt of Dr. Lorenso Courier. Pt said she tested positive for Covid on 12/1. I scheduled her appt for 21 days out and next available that worked with her schedule. Pt is aware of appt date and time.

## 2021-04-24 NOTE — Telephone Encounter (Signed)
Referral to hematologist placed today.  Thanks.

## 2021-04-24 NOTE — Assessment & Plan Note (Signed)
Patient is a positive for COVID-19.  Does work in a [employer] facility.  Did have detailed discussion about patient whether to do treatment or not after joint discussion decided to pursue treatment.  Patient also questions regards to getting with hematology for iron IV infusion.  Will get a message to her PCP in regards to this.  Did discuss signs and symptoms as when to seek urgent or emergent health care.  Patient acknowledged continue to monitor. Start antiviral today

## 2021-04-24 NOTE — Telephone Encounter (Signed)
Patient had virtual visit with Romilda Garret with [facility] and was asking about referral to oncologist/hematologist to discuss iron infusions. Advised patient message would be sent to her PCP to address and their office will reach out to her directly to discuss further.

## 2021-04-24 NOTE — Progress Notes (Signed)
Patient ID: Meghan Dawson, female    DOB: 1970/06/25, 50 y.o.   MRN: [id number]  Virtual visit completed through [employer], a video enabled telemedicine application. Due to national recommendations of social distancing due to COVID-19, a virtual visit is felt to be most appropriate for this patient at this time. Reviewed limitations, risks, security and privacy concerns of performing a virtual visit and the availability of in person appointments. I also reviewed that there may be a patient responsible charge related to this service. The patient agreed to proceed.   Patient location: home Provider location: [place] [facility], office Persons participating in this virtual visit: patient, provider   If any vitals were documented, they were collected by patient at home unless specified below.    Temp 98.2 F (36.8 C) Comment: per patient  LMP 04/17/2021    CC: Covid 19 Subjective:   HPI: Meghan Dawson is a 50 y.o. female presenting on 04/24/2021 for Covid Positive (On 04/20/21, sx started on 04/19/21- cough, stuffy nose, sore throat, minor aches, had a slight chest pain yesterday 04/23/21 that lasted a few seconds and then went away. )  Symptoms started on 04/19/2021 Pfizer vaccines x 2 and boosted Works at skilled nrusing fac OTC Cough drops and thera flu. Tylenol for headache   Relevant past medical, surgical, family and social history reviewed and updated as indicated. Interim medical history since our last visit reviewed. Allergies and medications reviewed and updated. Outpatient Medications Prior to Visit  Medication Sig Dispense Refill   BIOTIN PO Take 3 tablets by mouth daily.     ferrous sulfate 325 (65 FE) MG tablet Take 325 mg by mouth daily with breakfast.     Multiple Vitamins-Minerals (MULTIPLE VITAMINS/WOMENS PO) Take 2 tablets by mouth daily.     Omega-3 Fatty Acids (FISH OIL) 1000 MG CPDR Take 1,000 mg by mouth daily.     vitamin C (ASCORBIC ACID) 250 MG tablet Take  250 mg by mouth daily.     No facility-administered medications prior to visit.     Per HPI unless specifically indicated in ROS section below Review of Systems  Constitutional:  Positive for fatigue. Negative for chills and fever.  HENT:  Positive for congestion and sore throat. Negative for ear discharge, ear pain, sinus pressure and sinus pain.   Respiratory:  Positive for cough. Negative for shortness of breath.   Cardiovascular:  Negative for chest pain.  Gastrointestinal:  Negative for abdominal pain, diarrhea, nausea and vomiting.  Musculoskeletal:  Negative for arthralgias and myalgias.  Neurological:  Positive for headaches.  Objective:  Temp 98.2 F (36.8 C) Comment: per patient  LMP 04/17/2021   Wt Readings from Last 3 Encounters:  02/20/21 187 lb (84.8 kg)  06/01/20 196 lb (88.9 kg)  05/10/20 191 lb 12.8 oz (87 kg)       Physical exam: Gen: alert, NAD, not ill appearing Pulm: speaks in complete sentences without increased work of breathing Psych: normal mood, normal thought content      Results for orders placed or performed in visit on 02/20/21  CBC with Differential/Platelet  Result Value Ref Range   WBC 5.6 4.0 - 10.5 K/uL   RBC 5.04 3.87 - 5.11 Mil/uL   Hemoglobin 13.7 12.0 - 15.0 g/dL   HCT 40.8 36.0 - 46.0 %   MCV 81.0 78.0 - 100.0 fl   MCHC 33.5 30.0 - 36.0 g/dL   RDW 16.1 (H) 11.5 - 15.5 %  Platelets 254.0 150.0 - 400.0 K/uL   Neutrophils Relative % 48.5 43.0 - 77.0 %   Lymphocytes Relative 37.9 12.0 - 46.0 %   Monocytes Relative 10.1 3.0 - 12.0 %   Eosinophils Relative 2.9 0.0 - 5.0 %   Basophils Relative 0.6 0.0 - 3.0 %   Neutro Abs 2.7 1.4 - 7.7 K/uL   Lymphs Abs 2.1 0.7 - 4.0 K/uL   Monocytes Absolute 0.6 0.1 - 1.0 K/uL   Eosinophils Absolute 0.2 0.0 - 0.7 K/uL   Basophils Absolute 0.0 0.0 - 0.1 K/uL  Comprehensive metabolic panel  Result Value Ref Range   Sodium 139 135 - 145 mEq/L   Potassium 4.0 3.5 - 5.1 mEq/L   Chloride 105 96 -  112 mEq/L   CO2 26 19 - 32 mEq/L   Glucose, Bld 83 70 - 99 mg/dL   BUN 13 6 - 23 mg/dL   Creatinine, Ser 0.70 0.40 - 1.20 mg/dL   Total Bilirubin 0.4 0.2 - 1.2 mg/dL   Alkaline Phosphatase 68 39 - 117 U/L   AST 17 0 - 37 U/L   ALT 18 0 - 35 U/L   Total Protein 7.2 6.0 - 8.3 g/dL   Albumin 4.3 3.5 - 5.2 g/dL   GFR 101.67 >60.00 mL/min   Calcium 9.4 8.4 - 10.5 mg/dL  Hemoglobin A1c  Result Value Ref Range   Hgb A1c MFr Bld 5.6 4.6 - 6.5 %  Lipid panel  Result Value Ref Range   Cholesterol 193 0 - 200 mg/dL   Triglycerides 163.0 (H) 0.0 - 149.0 mg/dL   HDL 54.70 >39.00 mg/dL   VLDL 32.6 0.0 - 40.0 mg/dL   LDL Cholesterol 106 (H) 0 - 99 mg/dL   Total CHOL/HDL Ratio 4    NonHDL 138.61   TSH  Result Value Ref Range   TSH 1.28 0.35 - 5.50 uIU/mL   Assessment & Plan:   Problem List Items Addressed This Visit       Other   COVID-19 - Primary    Patient is a positive for COVID-19.  Does work in a [employer] facility.  Did have detailed discussion about patient whether to do treatment or not after joint discussion decided to pursue treatment.  Patient also questions regards to getting with hematology for iron IV infusion.  Will get a message to her PCP in regards to this.  Did discuss signs and symptoms as when to seek urgent or emergent health care.  Patient acknowledged continue to monitor. Start antiviral today      Relevant Medications   nirmatrelvir/ritonavir EUA (PAXLOVID) 20 x 150 MG & 10 x 100MG  TABS     No orders of the defined types were placed in this encounter.  No orders of the defined types were placed in this encounter.   I discussed the assessment and treatment plan with the patient. The patient was provided an opportunity to ask questions and all were answered. The patient agreed with the plan and demonstrated an understanding of the instructions. The patient was advised to call back or seek an in-person evaluation if the symptoms worsen or if the condition  fails to improve as anticipated.  Follow up plan: No follow-ups on file.  Meghan Garret, NP

## 2021-04-24 NOTE — Telephone Encounter (Signed)
noted 

## 2021-05-08 ENCOUNTER — Ambulatory Visit (AMBULATORY_SURGERY_CENTER): Payer: Self-pay

## 2021-05-08 ENCOUNTER — Other Ambulatory Visit: Payer: Self-pay

## 2021-05-08 VITALS — Ht 69.0 in | Wt 190.0 lb

## 2021-05-08 DIAGNOSIS — Z86010 Personal history of colonic polyps: Secondary | ICD-10-CM

## 2021-05-08 DIAGNOSIS — Z1211 Encounter for screening for malignant neoplasm of colon: Secondary | ICD-10-CM

## 2021-05-08 MED ORDER — NA SULFATE-K SULFATE-MG SULF 17.5-3.13-1.6 GM/177ML PO SOLN: 1.0000 | Freq: Once | ORAL | 0 refills | Status: AC

## 2021-05-08 NOTE — Progress Notes (Signed)
No egg or soy allergy known to patient  No issues known to pt with past sedation with any surgeries or procedures Patient denies ever being told they had issues or difficulty with intubation  No FH of Malignant Hyperthermia Pt is not on diet pills Pt is not on home 02  Pt is not on blood thinners  Pt denies issues with constipation at this time; No A fib or A flutter Pt is fully vaccinated for Covid x 2 + booster; NO PA's for preps discussed with pt in PV today  Discussed with pt there will be an out-of-pocket cost for prep and that varies from $0 to 70 +  dollars - pt verbalized understanding  Due to the COVID-19 pandemic we are asking patients to follow certain guidelines in PV and the [place]   Pt aware of COVID protocols and [facility] guidelines

## 2021-05-17 ENCOUNTER — Inpatient Hospital Stay (HOSPITAL_BASED_OUTPATIENT_CLINIC_OR_DEPARTMENT_OTHER): Payer: 59 | Admitting: Hematology and Oncology

## 2021-05-17 ENCOUNTER — Inpatient Hospital Stay: Payer: 59 | Attending: Hematology and Oncology | Admitting: Hematology and Oncology

## 2021-05-17 ENCOUNTER — Other Ambulatory Visit: Payer: Self-pay

## 2021-05-17 ENCOUNTER — Encounter: Payer: Self-pay | Admitting: *Deleted

## 2021-05-17 ENCOUNTER — Other Ambulatory Visit: Payer: Self-pay | Admitting: Hematology and Oncology

## 2021-05-17 VITALS — BP 114/62 | HR 94 | Temp 97.7°F | Resp 20 | Wt 192.0 lb

## 2021-05-17 DIAGNOSIS — N920 Excessive and frequent menstruation with regular cycle: Secondary | ICD-10-CM | POA: Diagnosis not present

## 2021-05-17 DIAGNOSIS — D509 Iron deficiency anemia, unspecified: Secondary | ICD-10-CM | POA: Diagnosis not present

## 2021-05-17 DIAGNOSIS — D500 Iron deficiency anemia secondary to blood loss (chronic): Secondary | ICD-10-CM

## 2021-05-17 DIAGNOSIS — Z79899 Other long term (current) drug therapy: Secondary | ICD-10-CM | POA: Diagnosis not present

## 2021-05-17 DIAGNOSIS — E538 Deficiency of other specified B group vitamins: Secondary | ICD-10-CM | POA: Diagnosis not present

## 2021-05-17 LAB — CBC WITH DIFFERENTIAL (CANCER CENTER ONLY)
Abs Immature Granulocytes: 0.01 10*3/uL (ref 0.00–0.07)
Basophils Absolute: 0.1 10*3/uL (ref 0.0–0.1)
Basophils Relative: 1 %
Eosinophils Absolute: 0.2 10*3/uL (ref 0.0–0.5)
Eosinophils Relative: 3 %
HCT: 37.6 % (ref 36.0–46.0)
Hemoglobin: 13.2 g/dL (ref 12.0–15.0)
Immature Granulocytes: 0 %
Lymphocytes Relative: 34 %
Lymphs Abs: 2 10*3/uL (ref 0.7–4.0)
MCH: 28.1 pg (ref 26.0–34.0)
MCHC: 35.1 g/dL (ref 30.0–36.0)
MCV: 80 fL (ref 80.0–100.0)
Monocytes Absolute: 0.4 10*3/uL (ref 0.1–1.0)
Monocytes Relative: 7 %
Neutro Abs: 3.2 10*3/uL (ref 1.7–7.7)
Neutrophils Relative %: 55 %
Platelet Count: 247 10*3/uL (ref 150–400)
RBC: 4.7 MIL/uL (ref 3.87–5.11)
RDW: 15.4 % (ref 11.5–15.5)
WBC Count: 5.9 10*3/uL (ref 4.0–10.5)
nRBC: 0 % (ref 0.0–0.2)

## 2021-05-17 LAB — CMP (CANCER CENTER ONLY)
ALT: 19 U/L (ref 0–44)
AST: 14 U/L — ABNORMAL LOW (ref 15–41)
Albumin: 4.1 g/dL (ref 3.5–5.0)
Alkaline Phosphatase: 60 U/L (ref 38–126)
Anion gap: 9 (ref 5–15)
BUN: 13 mg/dL (ref 6–20)
CO2: 23 mmol/L (ref 22–32)
Calcium: 9.1 mg/dL (ref 8.9–10.3)
Chloride: 107 mmol/L (ref 98–111)
Creatinine: 0.73 mg/dL (ref 0.44–1.00)
GFR, Estimated: >60 mL/min (ref >60)
Glucose, Bld: 146 mg/dL — ABNORMAL HIGH (ref 70–99)
Potassium: 3.8 mmol/L (ref 3.5–5.1)
Sodium: 139 mmol/L (ref 135–145)
Total Bilirubin: 0.4 mg/dL (ref 0.3–1.2)
Total Protein: 7.3 g/dL (ref 6.5–8.1)

## 2021-05-17 LAB — FERRITIN: Ferritin: 33 ng/mL (ref 11–307)

## 2021-05-17 LAB — IRON AND IRON BINDING CAPACITY (CC-WL,HP ONLY)
Iron: 94 ug/dL (ref 28–170)
Saturation Ratios: 21 % (ref 10.4–31.8)
TIBC: 456 ug/dL — ABNORMAL HIGH (ref 250–450)
UIBC: 362 ug/dL (ref 148–442)

## 2021-05-17 LAB — RETIC PANEL
Immature Retic Fract: 11.9 % (ref 2.3–15.9)
RBC.: 4.72 MIL/uL (ref 3.87–5.11)
Retic Count, Absolute: 98.2 10*3/uL (ref 19.0–186.0)
Retic Ct Pct: 2.1 % (ref 0.4–3.1)
Reticulocyte Hemoglobin: 33.1 pg (ref >27.9)

## 2021-05-17 LAB — FOLATE: Folate: 16.9 ng/mL (ref >5.9)

## 2021-05-17 LAB — VITAMIN B12: Vitamin B-12: 363 pg/mL (ref 180–914)

## 2021-05-17 NOTE — Progress Notes (Signed)
[place] [phone]   [phone]  PROGRESS NOTE  Patient Care Team: Horald Pollen, MD as PCP - General (Internal Medicine)  Hematological/Oncological History # Iron Deficiency Anemia 1) 12/03/2018: WBC 5.3, Hgb 12.5, Plt 401. MCV 76.2. Iron 28, TIBC 455, Sat 6%, ferritin 7 2) 03/03/2019: WBC 7.0, Hgb 11.8, Plt 345. MCV 76.6. Iron 24, TIBC 436, Sat 6%, ferritin 5 3) 04/29/2019: WBC 7.0, Hgb 11.5, Plt 363. MCV 75.7  4) 05/08/2019: establish care with Dr. Lorenso Courier. Continued on PO iron 325mg  daily.  5) 08/17/2019: WBC 5.7, Hgb 11.6, MCV 73.5, Plt 409.  6) 05/17/2021: WBC 5.9, Hgb 13.2, MCV 80, Plt 247  Interval History:  Meghan Dawson 50 y.o. female with medical history significant for iron deficiency anemia 2/2 to GYN blood loss who presents for a follow up visit. The patient's last visit was on 08/17/2019. In the interim since the last visit  Meghan Dawson was briefly lost to follow up.   On exam today Meghan Dawson notes she did not complete the IV iron therapy as requested during her last visit in March 2021 and was lost to follow-up.  She notes that her energy levels remain low and that she is now eager to boost her iron levels.  She reports her major symptoms are lethargy, sweaty palms, and "white hands".  She notes that sometimes it is difficult to concentrate.  She is currently taking ferrous sulfate 325 mg daily and is tolerating it well.  She notes that these pills do occasionally cause some constipation which she relieves with prune juice.  She reports her last round of menstrual cycles were "terrible".  She continues to work with OB/GYN to get this under better control.  The patient reports that otherwise she has had no major changes in her health since her last evaluation in December.  A full 10 point ROS is listed below.  MEDICAL HISTORY:  Past Medical History:  Diagnosis Date   Blood transfusion without reported diagnosis 03/2019   Cataract     bilateral sx   Headache    Iron deficiency anemia    on meds   LBBB (left bundle branch block) 2020   Menorrhagia    Seasonal allergies    Seizures ([place])    age 61- related to headaches per patient   Vitamin B 12 deficiency     SURGICAL HISTORY: Past Surgical History:  Procedure Laterality Date   APPENDECTOMY     age 42   BREAST BIOPSY     BREAST SURGERY Right    blocked milk duct   CATARACT EXTRACTION, BILATERAL     CYST EXCISION     Forehead   DILITATION & CURRETTAGE/HYSTROSCOPY WITH NOVASURE ABLATION N/A 05/10/2020   Procedure: DILATATION & CURETTAGE/HYSTEROSCOPY WITH NOVASURE ABLATION;  Surgeon: Griffin Basil, MD;  Location: [facility];  Service: Gynecology;  Laterality: N/A;   TUBAL LIGATION  1998   WISDOM TOOTH EXTRACTION      SOCIAL HISTORY: Social History   Socioeconomic History   Marital status: Married    Spouse name: Not on file   Number of children: Not on file   Years of education: Not on file   Highest education level: Not on file  Occupational History   Not on file  Tobacco Use   Smoking status: Never   Smokeless tobacco: Never  Vaping Use   Vaping Use: Never used  Substance and Sexual Activity   Alcohol use: Not Currently  Comment: occ   Drug use: Never   Sexual activity: Yes    Partners: Male    Birth control/protection: Surgical  Other Topics Concern   Not on file  Social History Narrative   Not on file   Social Determinants of Health   Financial Resource Strain: Not on file  Food Insecurity: Not on file  Transportation Needs: Not on file  Physical Activity: Not on file  Stress: Not on file  Social Connections: Not on file  Intimate Partner Violence: Not on file    FAMILY HISTORY: Family History  Problem Relation Age of Onset   Colon polyps Mother    Hypertension Mother    Miscarriages / [place] Mother    Hyperlipidemia Mother    Diabetes Mother    Hypertension Father    Heart disease Father    Diabetes Father     Hypertension Sister    Miscarriages / Stillbirths Sister    Diabetes Sister    Hypertension Brother    Breast cancer Maternal Aunt    Hypertension Maternal Grandmother    Hypertension Maternal Grandfather    Colon cancer Neg Hx    Esophageal cancer Neg Hx    Rectal cancer Neg Hx    Stomach cancer Neg Hx     ALLERGIES:  is allergic to aspirin.  MEDICATIONS:  Current Outpatient Medications  Medication Sig Dispense Refill   BIOTIN PO Take 3 tablets by mouth daily.     ferrous sulfate 325 (65 FE) MG tablet Take 325 mg by mouth daily with breakfast.     Multiple Vitamins-Minerals (MULTIPLE VITAMINS/WOMENS PO) Take 2 tablets by mouth daily.     Omega-3 Fatty Acids (FISH OIL) 1000 MG CPDR Take 1,000 mg by mouth daily.     vitamin C (ASCORBIC ACID) 250 MG tablet Take 250 mg by mouth daily.     No current facility-administered medications for this visit.    REVIEW OF SYSTEMS:   Constitutional: ( - ) fevers, ( - )  chills , ( - ) night sweats Eyes: ( - ) blurriness of vision, ( - ) double vision, ( - ) watery eyes Ears, nose, mouth, throat, and face: ( - ) mucositis, ( - ) sore throat Respiratory: ( - ) cough, ( - ) dyspnea, ( - ) wheezes Cardiovascular: ( - ) palpitation, ( - ) chest discomfort, ( - ) lower extremity swelling Gastrointestinal:  ( - ) nausea, ( - ) heartburn, ( - ) change in bowel habits Skin: ( - ) abnormal skin rashes Lymphatics: ( - ) new lymphadenopathy, ( - ) easy bruising Neurological: ( - ) numbness, ( - ) tingling, ( - ) new weaknesses Behavioral/Psych: ( - ) mood change, ( - ) new changes  All other systems were reviewed with the patient and are negative.  PHYSICAL EXAMINATION: ECOG PERFORMANCE STATUS: 1 - Symptomatic but completely ambulatory  Vitals:   05/17/21 1025  BP: 114/62  Pulse: 94  Resp: 20  Temp: 97.7 F (36.5 C)  SpO2: 98%    Filed Weights   05/17/21 1025  Weight: 192 lb (87.1 kg)     GENERAL: well appearing middle aged [place]  American female in NAD  SKIN: skin color, texture, turgor are normal, no rashes or significant lesions EYES: conjunctiva are pink and non-injected, sclera clear LUNGS: clear to auscultation and percussion with normal breathing effort HEART: regular rate & rhythm and no murmurs and no lower extremity edema ABDOMEN: soft, non-tender, non-distended, normal  bowel sounds Musculoskeletal: no cyanosis of digits and no clubbing  PSYCH: alert & oriented x 3, fluent speech NEURO: no focal motor/sensory deficits  LABORATORY DATA:  I have reviewed the data as listed CBC Latest Ref Rng & Units 05/17/2021 02/20/2021 05/10/2020  WBC 4.0 - 10.5 K/uL 5.9 5.6 5.4  Hemoglobin 12.0 - 15.0 g/dL 13.2 13.7 11.2(L)  Hematocrit 36.0 - 46.0 % 37.6 40.8 34.4(L)  Platelets 150 - 400 K/uL 247 254.0 272    CMP Latest Ref Rng & Units 05/17/2021 02/20/2021 08/17/2019  Glucose 70 - 99 mg/dL 146(H) 83 81  BUN 6 - 20 mg/dL 13 13 8   Creatinine 0.44 - 1.00 mg/dL 0.73 0.70 0.77  Sodium 135 - 145 mmol/L 139 139 139  Potassium 3.5 - 5.1 mmol/L 3.8 4.0 3.7  Chloride 98 - 111 mmol/L 107 105 103  CO2 22 - 32 mmol/L 23 26 28   Calcium 8.9 - 10.3 mg/dL 9.1 9.4 9.2  Total Protein 6.5 - 8.1 g/dL 7.3 7.2 8.1  Total Bilirubin 0.3 - 1.2 mg/dL 0.4 0.4 0.3  Alkaline Phos 38 - 126 U/L 60 68 69  AST 15 - 41 U/L 14(L) 17 18  ALT 0 - 44 U/L 19 18 16    Iron/TIBC/Ferritin/ %Sat    Component Value Date/Time   IRON 94 05/17/2021 1004   TIBC 456 (H) 05/17/2021 1004   FERRITIN 33 05/17/2021 1004   IRONPCTSAT 21 05/17/2021 1004   IRONPCTSAT 6 (L) 03/03/2019 1454    RADIOGRAPHIC STUDIES: None relevant to review.  No results found.  ASSESSMENT & PLAN Meghan Dawson 50 y.o. female with medical history significant for iron deficiency anemia 2/2 to GYN blood loss who presents for a follow up visit.    In the interim the patient has been evaluated by OB/GYN who notes that she is "too close to menopause" and that no further inventions are  required from their standpoint at this time.  There is no clear evidence of any GI bleeding and therefore I do not think colonoscopy would be required at this time.  I would recommend continued PO iron therapy at this time given her normal hemoglobin, however in the event that this does not improve her hemoglobin/symptoms we would need to consider repeat administration of IV iron therapy.  # Iron Deficiency Anemia 2/2 to GYN Blood Loss --continue PO iron supplementation with a source of vitamin C --patient underwent GYN evaluation and reportedly no intervention was recommend as she is relatively close to menopause -If patient were found to be iron deficient or if her labs are dropping, I would recommended the patient receive IV Feraheme 510mg  q 7days x 2 doses  --Hgb today normalized at 13.2, MCV 80, WBC 5.9, and Plt 247. --RTC in 6 months time to re-evaluate.    #Vitamin B12 deficiency --required Vitamin B12 shots in the past, not currently requiring these --prior normal levels of Vitamin B12/folate, MMA, and homocysteine --continue to monitor    No orders of the defined types were placed in this encounter.   All questions were answered. The patient knows to call the clinic with any problems, questions or concerns.  A total of more than 30 minutes were spent on this encounter and over half of that time was spent on counseling and coordination of care as outlined above.   Ledell Peoples, MD Department of Hematology/Oncology [facility] Phone: [phone] Pager: [phone] Email: [email or web address]  05/17/2021 1:30 PM

## 2021-05-19 ENCOUNTER — Telehealth: Payer: Self-pay | Admitting: *Deleted

## 2021-05-19 NOTE — Telephone Encounter (Signed)
Received call back from pt regarding recent lab results. Advised that her iron studies and HGB  have improved. She is to continue her oral iron at this time. Her next clinic visit is scheduled for June 2023.

## 2021-05-20 ENCOUNTER — Encounter: Payer: Self-pay | Admitting: Gastroenterology

## 2021-05-24 ENCOUNTER — Encounter: Payer: Self-pay | Admitting: Hematology and Oncology

## 2021-05-24 ENCOUNTER — Encounter: Payer: Self-pay | Admitting: Gastroenterology

## 2021-05-29 ENCOUNTER — Ambulatory Visit (AMBULATORY_SURGERY_CENTER): Payer: 59 | Admitting: Gastroenterology

## 2021-05-29 ENCOUNTER — Encounter: Payer: Self-pay | Admitting: Gastroenterology

## 2021-05-29 VITALS — BP 104/74 | HR 73 | Temp 97.5°F | Resp 16 | Ht 69.0 in | Wt 190.0 lb

## 2021-05-29 DIAGNOSIS — D122 Benign neoplasm of ascending colon: Secondary | ICD-10-CM

## 2021-05-29 DIAGNOSIS — Z1211 Encounter for screening for malignant neoplasm of colon: Secondary | ICD-10-CM

## 2021-05-29 MED ORDER — SODIUM CHLORIDE 0.9 % IV SOLN: 500.0000 mL | Freq: Once | INTRAVENOUS | Status: DC

## 2021-05-29 NOTE — Progress Notes (Signed)
Called to room to assist during endoscopic procedure.  Patient ID and intended procedure confirmed with present staff. Received instructions for my participation in the procedure from the performing physician.  

## 2021-05-29 NOTE — Patient Instructions (Signed)

## 2021-05-29 NOTE — Op Note (Signed)
[facility] Patient Name: Rudine Rieger Procedure Date: 05/29/2021 11:58 AM MRN: [id number] Endoscopist: Mauri Pole , MD Age: 51 Referring MD:  Date of Birth: Jul 08, 1970 Gender: Female Account #: [id number] Procedure:                Colonoscopy Indications:              Screening for colorectal malignant neoplasm Medicines:                Monitored Anesthesia Care Procedure:                Pre-Anesthesia Assessment:                           - Prior to the procedure, a History and Physical                            was performed, and patient medications and                            allergies were reviewed. The patient's tolerance of                            previous anesthesia was also reviewed. The risks                            and benefits of the procedure and the sedation                            options and risks were discussed with the patient.                            All questions were answered, and informed consent                            was obtained. Prior Anticoagulants: The patient has                            taken no previous anticoagulant or antiplatelet                            agents. ASA Grade Assessment: II - A patient with                            mild systemic disease. After reviewing the risks                            and benefits, the patient was deemed in                            satisfactory condition to undergo the procedure.                           After obtaining informed consent, the colonoscope  was passed under direct vision. Throughout the                            procedure, the patient's blood pressure, pulse, and                            oxygen saturations were monitored continuously. The                            Olympus PCF-H190DL (#[id number]) Colonoscope was                            introduced through the anus and advanced to the the                            cecum,  identified by appendiceal orifice and                            ileocecal valve. The colonoscopy was performed                            without difficulty. The patient tolerated the                            procedure well. The quality of the bowel                            preparation was excellent. The ileocecal valve,                            appendiceal orifice, and rectum were photographed. Scope In: 12:09:32 PM Scope Out: 12:23:50 PM Scope Withdrawal Time: 0 hours 9 minutes 18 seconds  Total Procedure Duration: 0 hours 14 minutes 18 seconds  Findings:                 The perianal and digital rectal examinations were                            normal.                           A less than 1 mm polyp was found in the ascending                            colon. The polyp was sessile. The polyp was removed                            with a cold biopsy forceps. Resection and retrieval                            were complete.                           Non-bleeding external and internal hemorrhoids were  found during retroflexion. The hemorrhoids were                            small. Complications:            No immediate complications. Estimated Blood Loss:     Estimated blood loss was minimal. Impression:               - One less than 1 mm polyp in the ascending colon,                            removed with a cold biopsy forceps. Resected and                            retrieved.                           - Non-bleeding external and internal hemorrhoids. Recommendation:           - Patient has a contact number available for                            emergencies. The signs and symptoms of potential                            delayed complications were discussed with the                            patient. Return to normal activities tomorrow.                            Written discharge instructions were provided to the                             patient.                           - Resume previous diet.                           - Continue present medications.                           - Await pathology results.                           - Repeat colonoscopy in 5-10 years for surveillance                            based on pathology results. Mauri Pole, MD 05/29/2021 12:28:32 PM This report has been signed electronically.

## 2021-05-29 NOTE — Progress Notes (Signed)
Vss nad transferred to pacu 

## 2021-05-29 NOTE — Progress Notes (Signed)
[facility] History and Physical   Primary Care Physician:  Horald Pollen, MD   Reason for Procedure:  Colorectal cancer screening  Plan:    Screening colonoscopy with possible interventions as needed     HPI: Meghan Dawson is a very pleasant 51 y.o. female here for screening colonoscopy. Denies any nausea, vomiting, abdominal pain, melena or bright red blood per rectum  The risks and benefits as well as alternatives of endoscopic procedure(s) have been discussed and reviewed. All questions answered. The patient agrees to proceed.    Past Medical History:  Diagnosis Date   Blood transfusion without reported diagnosis 03/2019   Cataract    bilateral sx   Headache    Iron deficiency anemia    on meds   LBBB (left bundle branch block) 2020   Menorrhagia    Seasonal allergies    Seizures ([place])    age 45- related to headaches per patient   Vitamin B 12 deficiency     Past Surgical History:  Procedure Laterality Date   APPENDECTOMY     age 76   BREAST BIOPSY     BREAST SURGERY Right    blocked milk duct   CATARACT EXTRACTION, BILATERAL     CYST EXCISION     Forehead   DILITATION & CURRETTAGE/HYSTROSCOPY WITH NOVASURE ABLATION N/A 05/10/2020   Procedure: DILATATION & CURETTAGE/HYSTEROSCOPY WITH NOVASURE ABLATION;  Surgeon: Griffin Basil, MD;  Location: [facility];  Service: Gynecology;  Laterality: N/A;   TUBAL LIGATION  1998   WISDOM TOOTH EXTRACTION      Prior to Admission medications   Medication Sig Start Date End Date Taking? Authorizing Provider  BIOTIN PO Take 3 tablets by mouth daily.   Yes [provider]  ferrous sulfate 325 (65 FE) MG tablet Take 325 mg by mouth daily with breakfast.   Yes [provider]  Multiple Vitamins-Minerals (MULTIPLE VITAMINS/WOMENS PO) Take 2 tablets by mouth daily.   Yes [provider]  Omega-3 Fatty Acids (FISH OIL) 1000 MG CPDR Take 1,000 mg by mouth daily.   Yes [provider]  vitamin C (ASCORBIC ACID) 250 MG tablet Take 250 mg by mouth daily.   Yes [provider]    Current Outpatient Medications  Medication Sig Dispense Refill   BIOTIN PO Take 3 tablets by mouth daily.     ferrous sulfate 325 (65 FE) MG tablet Take 325 mg by mouth daily with breakfast.     Multiple Vitamins-Minerals (MULTIPLE VITAMINS/WOMENS PO) Take 2 tablets by mouth daily.     Omega-3 Fatty Acids (FISH OIL) 1000 MG CPDR Take 1,000 mg by mouth daily.     vitamin C (ASCORBIC ACID) 250 MG tablet Take 250 mg by mouth daily.     Current Facility-Administered Medications  Medication Dose Route Frequency Provider Last Rate Last Admin   0.9 %  sodium chloride infusion  500 mL Intravenous Once Mauri Pole, MD        Allergies as of 05/29/2021 - Review Complete 05/29/2021  Allergen Reaction Noted   Aspirin Hives and Swelling 11/18/2017    Family History  Problem Relation Age of Onset   Colon polyps Mother    Hypertension Mother    Miscarriages / Stillbirths Mother    Hyperlipidemia Mother    Diabetes Mother    Hypertension Father    Heart disease Father    Diabetes Father    Hypertension Sister    Miscarriages / [place]  Sister    Diabetes Sister    Hypertension Brother    Breast cancer Maternal Aunt    Hypertension Maternal Grandmother    Hypertension Maternal Grandfather    Colon cancer Neg Hx    Esophageal cancer Neg Hx    Rectal cancer Neg Hx    Stomach cancer Neg Hx     Social History   Socioeconomic History   Marital status: Married    Spouse name: Not on file   Number of children: Not on file   Years of education: Not on file   Highest education level: Not on file  Occupational History   Not on file  Tobacco Use   Smoking status: Never   Smokeless tobacco: Never  Vaping Use   Vaping Use: Never used  Substance and Sexual Activity   Alcohol use: Not Currently    Comment: occ   Drug use: Never   Sexual activity: Yes     Partners: Male    Birth control/protection: Surgical  Other Topics Concern   Not on file  Social History Narrative   Not on file   Social Determinants of Health   Financial Resource Strain: Not on file  Food Insecurity: Not on file  Transportation Needs: Not on file  Physical Activity: Not on file  Stress: Not on file  Social Connections: Not on file  Intimate Partner Violence: Not on file    Review of Systems:  All other review of systems negative except as mentioned in the HPI.  Physical Exam: Vital signs in last 24 hours: BP 114/70    Pulse 83    Temp (!) 97.5 F (36.4 C)    Ht 5\' 9"  (1.753 m)    Wt 190 lb (86.2 kg)    LMP 05/17/2021 Comment: no chance of pregnancy per pt   SpO2 100%    BMI 28.06 kg/m  General:   Alert, NAD Lungs:  Clear .   Heart:  Regular rate and rhythm Abdomen:  Soft, nontender and nondistended. Neuro/Psych:  Alert and cooperative. Normal mood and affect. A and O x 3  Reviewed labs, radiology imaging, old records and pertinent past GI work up  Patient is appropriate for planned procedure(s) and anesthesia in an ambulatory setting   K. Denzil Magnuson , MD [phone]

## 2021-05-31 ENCOUNTER — Telehealth: Payer: Self-pay

## 2021-05-31 ENCOUNTER — Telehealth: Payer: Self-pay | Admitting: *Deleted

## 2021-05-31 NOTE — Telephone Encounter (Signed)
Left message on follow up call. 

## 2021-05-31 NOTE — Telephone Encounter (Signed)
No answer for post procedure followup call. Left VM. ?

## 2021-06-01 ENCOUNTER — Encounter: Payer: Self-pay | Admitting: Gastroenterology

## 2021-06-01 NOTE — Progress Notes (Signed)
Unable to reach patient and not able to leave VM at this time.

## 2021-06-02 NOTE — Telephone Encounter (Signed)
Received call back from pt. Reviewed her reent lab results with her. Advised that her iron and HGB have improved. She is to continue her oral iron tablets daily with a source of Vitamin C. She is to return to this clinic in June 2023. Pt voiced understanding.

## 2021-06-06 ENCOUNTER — Encounter: Payer: Self-pay | Admitting: Hematology and Oncology

## 2021-06-09 ENCOUNTER — Telehealth: Payer: Self-pay | Admitting: Emergency Medicine

## 2021-06-09 NOTE — Telephone Encounter (Signed)
Patient calling in  Would like for nurse to fax copy of updated immunization record showing her recent Flu vaccine 02/2021   [facility] [phone] ATTN: Elige Radon

## 2021-06-09 NOTE — Telephone Encounter (Signed)
Fax immunization record to number provided.

## 2021-08-17 ENCOUNTER — Encounter (HOSPITAL_COMMUNITY)
Admission: EM | Disposition: A | Discharge status: Home / Self Care | Payer: Self-pay | Source: Home / Self Care | Attending: Emergency Medicine

## 2021-08-17 ENCOUNTER — Emergency Department (HOSPITAL_COMMUNITY): Payer: 59

## 2021-08-17 ENCOUNTER — Encounter (HOSPITAL_COMMUNITY): Payer: Self-pay | Admitting: Oncology

## 2021-08-17 ENCOUNTER — Ambulatory Visit (HOSPITAL_COMMUNITY)
Admission: EM | Admit: 2021-08-17 | Discharge: 2021-08-17 | Disposition: A | Discharge status: Home / Self Care | Payer: 59 | Attending: Emergency Medicine | Admitting: Emergency Medicine

## 2021-08-17 ENCOUNTER — Emergency Department (HOSPITAL_BASED_OUTPATIENT_CLINIC_OR_DEPARTMENT_OTHER): Payer: 59 | Admitting: Anesthesiology

## 2021-08-17 ENCOUNTER — Other Ambulatory Visit: Payer: Self-pay

## 2021-08-17 ENCOUNTER — Emergency Department (HOSPITAL_COMMUNITY): Payer: 59 | Admitting: Anesthesiology

## 2021-08-17 DIAGNOSIS — K8010 Calculus of gallbladder with chronic cholecystitis without obstruction: Secondary | ICD-10-CM | POA: Diagnosis not present

## 2021-08-17 DIAGNOSIS — D649 Anemia, unspecified: Secondary | ICD-10-CM | POA: Insufficient documentation

## 2021-08-17 DIAGNOSIS — R1033 Periumbilical pain: Secondary | ICD-10-CM | POA: Diagnosis not present

## 2021-08-17 DIAGNOSIS — K8020 Calculus of gallbladder without cholecystitis without obstruction: Secondary | ICD-10-CM

## 2021-08-17 DIAGNOSIS — K812 Acute cholecystitis with chronic cholecystitis: Secondary | ICD-10-CM

## 2021-08-17 HISTORY — PX: CHOLECYSTECTOMY: SHX55

## 2021-08-17 LAB — COMPREHENSIVE METABOLIC PANEL
ALT: 21 U/L (ref 0–44)
AST: 18 U/L (ref 15–41)
Albumin: 3.4 g/dL — ABNORMAL LOW (ref 3.5–5.0)
Alkaline Phosphatase: 49 U/L (ref 38–126)
Anion gap: 6 (ref 5–15)
BUN: 14 mg/dL (ref 6–20)
CO2: 24 mmol/L (ref 22–32)
Calcium: 8.6 mg/dL — ABNORMAL LOW (ref 8.9–10.3)
Chloride: 105 mmol/L (ref 98–111)
Creatinine, Ser: 0.67 mg/dL (ref 0.44–1.00)
GFR, Estimated: >60 mL/min (ref >60)
Glucose, Bld: 126 mg/dL — ABNORMAL HIGH (ref 70–99)
Potassium: 3.9 mmol/L (ref 3.5–5.1)
Sodium: 135 mmol/L (ref 135–145)
Total Bilirubin: 0.2 mg/dL — ABNORMAL LOW (ref 0.3–1.2)
Total Protein: 6.1 g/dL — ABNORMAL LOW (ref 6.5–8.1)

## 2021-08-17 LAB — URINALYSIS, ROUTINE W REFLEX MICROSCOPIC
Bilirubin Urine: NEGATIVE
Glucose, UA: NEGATIVE mg/dL
Hgb urine dipstick: NEGATIVE
Ketones, ur: NEGATIVE mg/dL
Nitrite: NEGATIVE
Protein, ur: NEGATIVE mg/dL
Specific Gravity, Urine: 1.027 (ref 1.005–1.030)
pH: 7 (ref 5.0–8.0)

## 2021-08-17 LAB — CBC WITH DIFFERENTIAL/PLATELET
Abs Immature Granulocytes: 0.05 10*3/uL (ref 0.00–0.07)
Basophils Absolute: 0 10*3/uL (ref 0.0–0.1)
Basophils Relative: 0 %
Eosinophils Absolute: 0 10*3/uL (ref 0.0–0.5)
Eosinophils Relative: 0 %
HCT: 43.1 % (ref 36.0–46.0)
Hemoglobin: 14.7 g/dL (ref 12.0–15.0)
Immature Granulocytes: 1 %
Lymphocytes Relative: 13 %
Lymphs Abs: 1.3 10*3/uL (ref 0.7–4.0)
MCH: 28.2 pg (ref 26.0–34.0)
MCHC: 34.1 g/dL (ref 30.0–36.0)
MCV: 82.6 fL (ref 80.0–100.0)
Monocytes Absolute: 0.3 10*3/uL (ref 0.1–1.0)
Monocytes Relative: 3 %
Neutro Abs: 8.4 10*3/uL — ABNORMAL HIGH (ref 1.7–7.7)
Neutrophils Relative %: 83 %
Platelets: 282 10*3/uL (ref 150–400)
RBC: 5.22 MIL/uL — ABNORMAL HIGH (ref 3.87–5.11)
RDW: 15.9 % — ABNORMAL HIGH (ref 11.5–15.5)
WBC: 10 10*3/uL (ref 4.0–10.5)
nRBC: 0 % (ref 0.0–0.2)

## 2021-08-17 LAB — LIPASE, BLOOD: Lipase: 30 U/L (ref 11–51)

## 2021-08-17 LAB — PREGNANCY, URINE: Preg Test, Ur: NEGATIVE

## 2021-08-17 SURGERY — LAPAROSCOPIC CHOLECYSTECTOMY WITH INTRAOPERATIVE CHOLANGIOGRAM
Anesthesia: General

## 2021-08-17 MED ORDER — HYDROMORPHONE HCL 1 MG/ML IJ SOLN: 0.2500 mg | INTRAMUSCULAR | Status: DC | PRN

## 2021-08-17 MED ORDER — LACTATED RINGERS IV SOLN
INTRAVENOUS | Status: DC | PRN
  Administered 2021-08-17: 1000 mL

## 2021-08-17 MED ORDER — STERILE WATER FOR INJECTION IJ SOLN
INTRAMUSCULAR | Status: AC
  Filled 2021-08-17: qty 10

## 2021-08-17 MED ORDER — SODIUM CHLORIDE 0.9 % IV SOLN
2.0000 g | INTRAVENOUS | Status: DC
  Administered 2021-08-17: 2 g via INTRAVENOUS

## 2021-08-17 MED ORDER — DROPERIDOL 2.5 MG/ML IJ SOLN
INTRAMUSCULAR | Status: AC
  Filled 2021-08-17: qty 2

## 2021-08-17 MED ORDER — LACTATED RINGERS IV BOLUS
1000.0000 mL | Freq: Once | INTRAVENOUS | Status: AC
  Administered 2021-08-17: 1000 mL via INTRAVENOUS

## 2021-08-17 MED ORDER — MIDAZOLAM HCL 2 MG/2ML IJ SOLN
INTRAMUSCULAR | Status: AC
  Filled 2021-08-17: qty 2

## 2021-08-17 MED ORDER — MIDAZOLAM HCL 2 MG/2ML IJ SOLN
INTRAMUSCULAR | Status: DC | PRN
  Administered 2021-08-17: 2 mg via INTRAVENOUS

## 2021-08-17 MED ORDER — OXYCODONE HCL 5 MG/5ML PO SOLN: 5.0000 mg | Freq: Once | ORAL | Status: AC | PRN

## 2021-08-17 MED ORDER — 0.9 % SODIUM CHLORIDE (POUR BTL) OPTIME
TOPICAL | Status: DC | PRN
  Administered 2021-08-17: 1000 mL

## 2021-08-17 MED ORDER — FENTANYL CITRATE (PF) 250 MCG/5ML IJ SOLN
INTRAMUSCULAR | Status: DC | PRN
  Administered 2021-08-17 (×2): 100 ug via INTRAVENOUS
  Administered 2021-08-17: 50 ug via INTRAVENOUS

## 2021-08-17 MED ORDER — SODIUM CHLORIDE 0.9 % IV SOLN
INTRAVENOUS | Status: AC
  Filled 2021-08-17: qty 20

## 2021-08-17 MED ORDER — BUPIVACAINE-EPINEPHRINE 0.25% -1:200000 IJ SOLN
INTRAMUSCULAR | Status: DC | PRN
  Administered 2021-08-17: 30 mL

## 2021-08-17 MED ORDER — LIDOCAINE HCL (PF) 2 % IJ SOLN
INTRAMUSCULAR | Status: AC
  Filled 2021-08-17: qty 5

## 2021-08-17 MED ORDER — INDOCYANINE GREEN 25 MG IV SOLR
2.5000 mg | Freq: Once | INTRAVENOUS | Status: DC
  Filled 2021-08-17: qty 10

## 2021-08-17 MED ORDER — ACETAMINOPHEN 10 MG/ML IV SOLN
1000.0000 mg | Freq: Once | INTRAVENOUS | Status: DC | PRN
  Administered 2021-08-17: 1000 mg via INTRAVENOUS

## 2021-08-17 MED ORDER — SUGAMMADEX SODIUM 200 MG/2ML IV SOLN
INTRAVENOUS | Status: DC | PRN
  Administered 2021-08-17: 200 mg via INTRAVENOUS

## 2021-08-17 MED ORDER — ROCURONIUM BROMIDE 10 MG/ML (PF) SYRINGE
PREFILLED_SYRINGE | INTRAVENOUS | Status: AC
  Filled 2021-08-17: qty 10

## 2021-08-17 MED ORDER — OXYCODONE HCL 5 MG PO TABS: 5.0000 mg | ORAL_TABLET | Freq: Four times a day (QID) | ORAL | 0 refills | Status: DC | PRN

## 2021-08-17 MED ORDER — OXYCODONE HCL 5 MG PO TABS
5.0000 mg | ORAL_TABLET | Freq: Once | ORAL | Status: AC | PRN
  Administered 2021-08-17: 5 mg via ORAL

## 2021-08-17 MED ORDER — ACETAMINOPHEN 10 MG/ML IV SOLN
INTRAVENOUS | Status: AC
  Filled 2021-08-17: qty 100

## 2021-08-17 MED ORDER — MORPHINE SULFATE (PF) 4 MG/ML IV SOLN
4.0000 mg | Freq: Once | INTRAVENOUS | Status: AC
  Administered 2021-08-17: 4 mg via INTRAVENOUS
  Filled 2021-08-17: qty 1

## 2021-08-17 MED ORDER — PROPOFOL 10 MG/ML IV BOLUS
INTRAVENOUS | Status: DC | PRN
Start: 2021-08-17 — End: 2021-08-17
  Administered 2021-08-17: 180 mg via INTRAVENOUS

## 2021-08-17 MED ORDER — DEXAMETHASONE SODIUM PHOSPHATE 10 MG/ML IJ SOLN
INTRAMUSCULAR | Status: DC | PRN
  Administered 2021-08-17: 10 mg via INTRAVENOUS

## 2021-08-17 MED ORDER — FENTANYL CITRATE (PF) 250 MCG/5ML IJ SOLN
INTRAMUSCULAR | Status: AC
  Filled 2021-08-17: qty 5

## 2021-08-17 MED ORDER — ONDANSETRON HCL 4 MG/2ML IJ SOLN
INTRAMUSCULAR | Status: DC | PRN
  Administered 2021-08-17: 4 mg via INTRAVENOUS

## 2021-08-17 MED ORDER — OXYCODONE HCL 5 MG PO TABS
ORAL_TABLET | ORAL | Status: AC
  Filled 2021-08-17: qty 1

## 2021-08-17 MED ORDER — ONDANSETRON HCL 4 MG/2ML IJ SOLN
INTRAMUSCULAR | Status: AC
  Filled 2021-08-17: qty 2

## 2021-08-17 MED ORDER — LIDOCAINE 2% (20 MG/ML) 5 ML SYRINGE
INTRAMUSCULAR | Status: DC | PRN
  Administered 2021-08-17: 60 mg via INTRAVENOUS

## 2021-08-17 MED ORDER — ONDANSETRON HCL 4 MG/2ML IJ SOLN
4.0000 mg | Freq: Once | INTRAMUSCULAR | Status: AC
  Administered 2021-08-17: 4 mg via INTRAVENOUS
  Filled 2021-08-17: qty 2

## 2021-08-17 MED ORDER — HYDROMORPHONE HCL 1 MG/ML IJ SOLN: 1.0000 mg | Freq: Once | INTRAMUSCULAR | Status: DC

## 2021-08-17 MED ORDER — HYDROMORPHONE HCL 1 MG/ML IJ SOLN
1.0000 mg | Freq: Once | INTRAMUSCULAR | Status: AC
  Administered 2021-08-17: 1 mg via INTRAVENOUS
  Filled 2021-08-17: qty 1

## 2021-08-17 MED ORDER — ONDANSETRON HCL 4 MG/2ML IJ SOLN: 4.0000 mg | Freq: Once | INTRAMUSCULAR | Status: DC | PRN

## 2021-08-17 MED ORDER — DROPERIDOL 2.5 MG/ML IJ SOLN
INTRAMUSCULAR | Status: DC | PRN
  Administered 2021-08-17: .625 mg via INTRAVENOUS

## 2021-08-17 MED ORDER — SPY AGENT GREEN - (INDOCYANINE FOR INJECTION)
INTRAMUSCULAR | Status: DC | PRN
  Administered 2021-08-17: 2 mL via INTRAVENOUS

## 2021-08-17 MED ORDER — ROCURONIUM BROMIDE 10 MG/ML (PF) SYRINGE
PREFILLED_SYRINGE | INTRAVENOUS | Status: DC | PRN
  Administered 2021-08-17: 20 mg via INTRAVENOUS
  Administered 2021-08-17: 70 mg via INTRAVENOUS
  Administered 2021-08-17: 10 mg via INTRAVENOUS

## 2021-08-17 MED ORDER — BUPIVACAINE-EPINEPHRINE (PF) 0.25% -1:200000 IJ SOLN
INTRAMUSCULAR | Status: AC
  Filled 2021-08-17: qty 30

## 2021-08-17 MED ORDER — PROPOFOL 10 MG/ML IV BOLUS
INTRAVENOUS | Status: AC
  Filled 2021-08-17: qty 20

## 2021-08-17 MED ORDER — LACTATED RINGERS IV SOLN: INTRAVENOUS | Status: DC

## 2021-08-17 SURGICAL SUPPLY — 48 items
APPLICATOR ARISTA FLEXITIP XL (MISCELLANEOUS) IMPLANT
APPLIER CLIP 5 13 M/L LIGAMAX5 (MISCELLANEOUS) ×2
APPLIER CLIP ROT 10 11.4 M/L (STAPLE)
BAG COUNTER SPONGE SURGICOUNT (BAG) IMPLANT
BAG RETRIEVAL 10 (BASKET) ×1
CABLE HIGH FREQUENCY MONO STRZ (ELECTRODE) ×2 IMPLANT
CHLORAPREP W/TINT 26 (MISCELLANEOUS) ×2 IMPLANT
CLIP APPLIE 5 13 M/L LIGAMAX5 (MISCELLANEOUS) ×1 IMPLANT
CLIP APPLIE ROT 10 11.4 M/L (STAPLE) IMPLANT
COVER MAYO STAND XLG (MISCELLANEOUS) ×2 IMPLANT
COVER SURGICAL LIGHT HANDLE (MISCELLANEOUS) ×2 IMPLANT
DERMABOND ADVANCED (GAUZE/BANDAGES/DRESSINGS) ×1
DERMABOND ADVANCED .7 DNX12 (GAUZE/BANDAGES/DRESSINGS) ×1 IMPLANT
DISSECTOR BLUNT TIP ENDO 5MM (MISCELLANEOUS) IMPLANT
DRAPE C-ARM 42X120 X-RAY (DRAPES) IMPLANT
ELECT PENCIL ROCKER SW 15FT (MISCELLANEOUS) ×2 IMPLANT
ELECT REM PT RETURN 15FT ADLT (MISCELLANEOUS) ×2 IMPLANT
GLOVE SURG ENC MOIS LTX SZ7.5 (GLOVE) ×2 IMPLANT
GLOVE SURG UNDER LTX SZ8 (GLOVE) ×2 IMPLANT
GOWN STRL REUS W/ TWL XL LVL3 (GOWN DISPOSABLE) ×2 IMPLANT
GOWN STRL REUS W/TWL XL LVL3 (GOWN DISPOSABLE) ×2
GRASPER SUT TROCAR 14GX15 (MISCELLANEOUS) IMPLANT
HEMOSTAT ARISTA ABSORB 3G PWDR (HEMOSTASIS) IMPLANT
HEMOSTAT SNOW SURGICEL 2X4 (HEMOSTASIS) IMPLANT
IRRIG SUCT STRYKERFLOW 2 WTIP (MISCELLANEOUS) ×2
IRRIGATION SUCT STRKRFLW 2 WTP (MISCELLANEOUS) ×1 IMPLANT
KIT BASIN OR (CUSTOM PROCEDURE TRAY) ×2 IMPLANT
KIT TURNOVER KIT A (KITS) ×1 IMPLANT
NDL INSUFFLATION 14GA 120MM (NEEDLE) IMPLANT
NEEDLE INSUFFLATION 14GA 120MM (NEEDLE) IMPLANT
SCISSORS LAP 5X35 DISP (ENDOMECHANICALS) ×2 IMPLANT
SET CHOLANGIOGRAPH MIX (MISCELLANEOUS) IMPLANT
SET TUBE SMOKE EVAC HIGH FLOW (TUBING) ×2 IMPLANT
SLEEVE ADV FIXATION 5X100MM (TROCAR) ×4 IMPLANT
SOL ANTI FOG 6CC (MISCELLANEOUS) IMPLANT
SOLUTION ANTI FOG 6CC (MISCELLANEOUS) ×1
SPIKE FLUID TRANSFER (MISCELLANEOUS) ×2 IMPLANT
SUT MNCRL AB 4-0 PS2 18 (SUTURE) ×2 IMPLANT
SYR 10ML ECCENTRIC (SYRINGE) ×2 IMPLANT
SYS BAG RETRIEVAL 10MM (BASKET) ×1
SYSTEM BAG RETRIEVAL 10MM (BASKET) ×1 IMPLANT
TOWEL OR 17X26 10 PK STRL BLUE (TOWEL DISPOSABLE) ×2 IMPLANT
TOWEL OR NON WOVEN STRL DISP B (DISPOSABLE) IMPLANT
TRAY LAPAROSCOPIC (CUSTOM PROCEDURE TRAY) ×2 IMPLANT
TROCAR ADV FIXATION 12X100MM (TROCAR) IMPLANT
TROCAR ADV FIXATION 5X100MM (TROCAR) ×2 IMPLANT
TROCAR XCEL BLUNT TIP 100MML (ENDOMECHANICALS) ×2 IMPLANT
TROCAR XCEL NON-BLD 11X100MML (ENDOMECHANICALS) IMPLANT

## 2021-08-17 NOTE — Anesthesia Procedure Notes (Signed)
Procedure Name: Intubation ?Date/Time: 08/17/2021 12:31 PM ?Performed by: Lollie Sails, CRNA ?Pre-anesthesia Checklist: Patient identified, Emergency Drugs available, Suction available, Patient being monitored and Timeout performed ?Patient Re-evaluated:Patient Re-evaluated prior to induction ?Oxygen Delivery Method: Circle system utilized ?Preoxygenation: Pre-oxygenation with 100% oxygen ?Induction Type: IV induction ?Ventilation: Mask ventilation without difficulty ?Laryngoscope Size: Sabra Heck and 3 ?Grade View: Grade I ?Tube type: Oral ?Tube size: 7.5 mm ?Number of attempts: 1 ?Airway Equipment and Method: Stylet ?Placement Confirmation: ETT inserted through vocal cords under direct vision, positive ETCO2 and breath sounds checked- equal and bilateral ?Secured at: 22 cm ?Tube secured with: Tape ?Dental Injury: Teeth and Oropharynx as per pre-operative assessment  ? ? ? ? ?

## 2021-08-17 NOTE — Transfer of Care (Signed)
Immediate Anesthesia Transfer of Care Note ? ?Patient: Meghan Dawson ? ?Procedure(s) Performed: LAPAROSCOPIC CHOLECYSTECTOMY WITH ICG ? ?Patient Location: PACU ? ?Anesthesia Type:General ? ?Level of Consciousness: awake, alert , drowsy and patient cooperative ? ?Airway & Oxygen Therapy: Patient Spontanous Breathing and Patient connected to face mask oxygen ? ?Post-op Assessment: Report given to RN and Post -op Vital signs reviewed and stable ? ?Post vital signs: Reviewed and stable ? ?Last Vitals:  ?Vitals Value Taken Time  ?BP 137/73 08/17/21 1403  ?Temp    ?Pulse 102 08/17/21 1404  ?Resp 17 08/17/21 1404  ?SpO2 100 % 08/17/21 1404  ?Vitals shown include unvalidated device data. ? ?Last Pain:  ?Vitals:  ? 08/17/21 1202  ?TempSrc:   ?PainSc: 7   ?   ? ?Patients Stated Pain Goal: 4 (08/17/21 1202) ? ?Complications: No notable events documented. ?

## 2021-08-17 NOTE — Discharge Instructions (Signed)
[facility]  Please arrive at least 30 min before your appointment to complete your check in paperwork.  If you are unable to arrive 30 min prior to your appointment time we may have to cancel or reschedule you. LAPAROSCOPIC SURGERY: POST OP INSTRUCTIONS Always review your discharge instruction sheet given to you by the facility where your surgery was performed. IF YOU HAVE DISABILITY OR FAMILY LEAVE FORMS, YOU MUST BRING THEM TO THE OFFICE FOR PROCESSING.   DO NOT GIVE THEM TO YOUR DOCTOR.  PAIN CONTROL  First take acetaminophen (Tylenol) AND/or ibuprofen (Advil) to control your pain after surgery.  Follow directions on package.  Taking acetaminophen (Tylenol) and/or ibuprofen (Advil) regularly after surgery will help to control your pain and lower the amount of prescription pain medication you may need.  You should not take more than 4,000 mg (4 grams) of acetaminophen (Tylenol) in 24 hours.  You should not take ibuprofen (Advil), aleve, motrin, naprosyn or other NSAIDS if you have a history of stomach ulcers or chronic kidney disease.  A prescription for pain medication may be given to you upon discharge.  Take your pain medication as prescribed, if you still have uncontrolled pain after taking acetaminophen (Tylenol) or ibuprofen (Advil). Use ice packs to help control pain. If you need a refill on your pain medication, please contact your pharmacy.  They will contact our office to request authorization. Prescriptions will not be filled after 5pm or on week-ends.  HOME MEDICATIONS Take your usually prescribed medications unless otherwise directed.  DIET You should follow a light diet the first few days after arrival home.  Be sure to include lots of fluids daily. Avoid fatty, fried foods.   CONSTIPATION It is common to experience some constipation after surgery and if you are taking pain medication.  Increasing fluid intake and taking a stool softener (such as Colace)  will usually help or prevent this problem from occurring.  A mild laxative (Milk of Magnesia or Miralax) should be taken according to package instructions if there are no bowel movements after 48 hours.  WOUND/INCISION CARE Most patients will experience some swelling and bruising in the area of the incisions.  Ice packs will help.  Swelling and bruising can take several days to resolve.  Unless discharge instructions indicate otherwise, follow guidelines below  STERI-STRIPS - you may remove your outer bandages 48 hours after surgery, and you may shower at that time.  You have steri-strips (small skin tapes) in place directly over the incision.  These strips should be left on the skin for 7-10 days.   DERMABOND/SKIN GLUE - you may shower in 24 hours.  The glue will flake off over the next 2-3 weeks. Any sutures or staples will be removed at the office during your follow-up visit.  ACTIVITIES You may resume regular (light) daily activities beginning the next day--such as daily self-care, walking, climbing stairs--gradually increasing activities as tolerated.  You may have sexual intercourse when it is comfortable.  Refrain from any heavy lifting or straining until approved by your doctor. You may drive when you are no longer taking prescription pain medication, you can comfortably wear a seatbelt, and you can safely maneuver your car and apply brakes.  FOLLOW-UP You should see your doctor in the office for a follow-up appointment approximately 2-3 weeks after your surgery.  You should have been given your post-op/follow-up appointment when your surgery was scheduled.  If you did not receive a post-op/follow-up appointment, make sure   that you call for this appointment within a day or two after you arrive home to insure a convenient appointment time.  OTHER INSTRUCTIONS  WHEN TO CALL YOUR DOCTOR: Fever over 101.0 Inability to urinate Continued bleeding from incision. Increased pain, redness, or  drainage from the incision. Increasing abdominal pain  The clinic staff is available to answer your questions during regular business hours.  Please don't hesitate to call and ask to speak to one of the nurses for clinical concerns.  If you have a medical emergency, go to the nearest emergency room or call 911.  A surgeon from [facility] is always on call at the hospital. [address], [address], [place], [place]  [address] ? [address] [address], [place],    [address] [phone] ? [phone] ? FAX [phone]   

## 2021-08-17 NOTE — Op Note (Addendum)
08/17/2021 1:48 PM ? ?PATIENT: Meghan Dawson  50 y.o. female ? ?Patient Care Team: ?Horald Pollen, MD as PCP - General (Internal Medicine) ? ?PRE-OPERATIVE DIAGNOSIS: Acute cholecystitis ? ?POST-OPERATIVE DIAGNOSIS: Acute on chronic cholecystitis ? ?PROCEDURE: Laparoscopic cholecystectomy with intraoperative ICG cholangiography ? ?SURGEON: Sharon Mt. Kristopher Delk, MD ? ?ASSISTANT: Margie Billet, PA-C ? ?ANESTHESIA: General endotracheal ? ?EBL: Total I/O ?In: 2100 [I.V.:1000; IV Piggyback:1100] ?Out: 5 [Blood:5] ? ?DRAINS: None ? ?SPECIMEN: Gallbladder ? ?COUNTS: Sponge, needle and instrument counts were reported correct x2 at the conclusion of the operation ? ?DISPOSITION: PACU in satisfactory condition ? ?COMPLICATIONS: None ? ?FINDINGS: Acutely inflamed appearing gallbladder with chronic fibrotic adhesions. ICG cholangiography demonstrates opacification of the common bile duct evident through the periportal fatty tissue, initially nonvisualized gallbladder but after manipulation throughout procedure and likely dislodgement of a large infundibulum stone, the gallbladder ultimately filled. The duodenum fills as well consistent with a patent biliary system.  ? ?DESCRIPTION: ? ?The patient was identified & brought into the operating room. She was then positioned supine on the OR table. SCDs were in place and active during the entire case. She then underwent general endotracheal anesthesia. Pressure points were padded. Hair on the abdomen was clipped by the OR team. The abdomen was prepped and draped in the standard sterile fashion. Antibiotics were administered. A surgical timeout was performed and confirmed our plan.  ? ?A periumbilical incision was made. The umbilical stalk was grasped and retracted outwardly. The infraumbilical fascia was identified and incised. The peritoneal cavity was gently entered bluntly. A purse-string 0 Vicryl suture was placed. The Hasson cannula was inserted into the peritoneal cavity  and insufflation with CO2 commenced to 21mHg. A laparoscope was inserted into the peritoneal cavity and inspection confirmed no evidence of trocar site complications. The patient was then positioned in reverse Trendelenburg with slight left side down. 3 additional 54mtrocars were placed along the right subcostal line - one 57m78mort in mid subcostal region, another 57mm19mrt in the right flank near the anterior axillary line, and a third 57mm 102mt in the left subxiphoid region obliquely near the falciform ligament. ? ?The liver and gallbladder were inspected. There are omental adhesions across much of the gallbladder consistent with prior cholecystitis - these are somewhat fibrotic. These are taken down with electrocautery. The gallbladder fundus was grasped and elevated cephalad. An additional grasper was then placed on the infundibulum of the gallbladder and the infundibulum was retracted laterally. More adhesions were taken down of omentum. The pylorus and 1st portion of duodenum are partially adherent but with filmy adhesions between. These are lysed sharply. Staying high on the gallbladder, the peritoneum on both sides of the gallbladder was opened with hook cautery. Gentle blunt dissection was then employed with a MarylIT consultanting down into [place]ot[place] One cystic duct was identified and carefully circumferentially dissected. The cystic artery was also identified and carefully circumferentially dissected. The space between the cystic artery and hepatocystic plate was developed such that a good view of the liver could be seen through a window medial to the cystic artery. The triangle of Calot had been cleared of all fibrofatty tissue. At this point, a critical view of safety was achieved and the only structures visualized was the skeletonized cystic duct laterally, the skeletonized cystic artery and the liver through the window medial to the artery. No posterior cystic artery was noted ? ?At this  point attention was turned to ICG cholangiography.  Under near infrared fluorescence, there  is avid uptake of the tracer throughout her liver.  There is clearance of this tracer into the common bile duct seen through the fibrofatty connective tissue near the porta.  There is also opacification of the second and third portion of the duodenum.  There is no opacification of the stomach.  This is consistent in appearance with a normal ICG cholangiogram.  The cystic duct ultimately backfills as does the gallbladder.  Initially, this was not apparent until the large gallstone in the neck of her gallbladder was manipulated.  ? ?The cystic duct and artery were clipped with 2 clips on the patient side and 1 clip on the specimen side. The cystic duct and artery were then divided. The gallbladder was then freed from its remaining attachments to the liver using electrocautery and placed into an endocatch bag. The RUQ was gently irrigated with sterile saline. Hemostasis was then verified. The clips were in good position; the gallbladder fossa was dry. The rest of the abdomen was inspected no injury nor bleeding elsewhere was identified. ? ?The endocatch bag containing the gallbladder was then removed from the umbilical port site and passed off as specimen. The RUQ ports were removed under direct visualization and noted to be hemostatic. The umbilical fascia was then closed using the 0 Vicryl purse-string suture. The fascia was palpated and noted to be completely closed. The skin of all incision sites was approximated with 4-0 monocryl subcuticular suture and dermabond applied. She was then awakened from anesthesia, extubated, and transferred to a stretcher for transport to PACU in satisfactory condition. ? ?

## 2021-08-17 NOTE — Anesthesia Preprocedure Evaluation (Signed)
Anesthesia Evaluation  ?Patient identified by MRN, date of birth, ID band ?Patient awake ? ? ? ?Reviewed: ?Allergy & Precautions, NPO status , Patient's Chart, lab work & pertinent test results ? ?Airway ?Mallampati: II ? ?TM Distance: >3 FB ?Neck ROM: Full ? ? ? Dental ?no notable dental hx. ? ?  ?Pulmonary ?neg pulmonary ROS,  ?  ?Pulmonary exam normal ?breath sounds clear to auscultation ? ? ? ? ? ? Cardiovascular ?Normal cardiovascular exam+ dysrhythmias  ?Rhythm:Regular Rate:Normal ? ?LBBB ?  ?Neuro/Psych ?negative neurological ROS ? negative psych ROS  ? GI/Hepatic ?negative GI ROS, Neg liver ROS,   ?Endo/Other  ?negative endocrine ROS ? Renal/GU ?negative Renal ROS  ?negative genitourinary ?  ?Musculoskeletal ?negative musculoskeletal ROS ?(+)  ? Abdominal ?  ?Peds ?negative pediatric ROS ?(+)  Hematology ?negative hematology ROS ?(+)   ?Anesthesia Other Findings ? ? Reproductive/Obstetrics ?negative OB ROS ? ?  ? ? ? ? ? ? ? ? ? ? ? ? ? ?  ?  ? ? ? ? ? ? ? ? ?Anesthesia Physical ?Anesthesia Plan ? ?ASA: 2 ? ?Anesthesia Plan: General  ? ?Post-op Pain Management: Minimal or no pain anticipated  ? ?Induction: Intravenous ? ?PONV Risk Score and Plan: 4 or greater and Ondansetron, Dexamethasone, Droperidol, Midazolam and Treatment may vary due to age or medical condition ? ?Airway Management Planned: Oral ETT ? ?Additional Equipment:  ? ?Intra-op Plan:  ? ?Post-operative Plan: Extubation in OR ? ?Informed Consent: I have reviewed the patients History and Physical, chart, labs and discussed the procedure including the risks, benefits and alternatives for the proposed anesthesia with the patient or authorized representative who has indicated his/her understanding and acceptance.  ? ? ? ?Dental advisory given ? ?Plan Discussed with: CRNA and Surgeon ? ?Anesthesia Plan Comments:   ? ? ? ? ? ? ?Anesthesia Quick Evaluation ? ?

## 2021-08-17 NOTE — ED Triage Notes (Signed)
Pt c/o severe abdominal pain near umbilicus. Pt states pain started at 0300, +N/V denies diarrhea.    ?

## 2021-08-17 NOTE — H&P (Signed)
? ?CC: MEG/RUQ pain ? ?HPI: ?Meghan Dawson is an 51 y.o. female hx of anemia, presented to the emergency room with acute onset of right upper quadrant and midepigastric abdominal pain.  She had an attack of this pain yesterday following eating pizza that subsided after about an hour.  After it went away she was able to go to bed.  She then woke up at 3 AM with again onset of the same kind of pain.  This is associated with some nausea/vomiting.  She has had persistent midepigastric abdominal pain since that time and presented for further evaluation. ? ?LFTs normal with total bilirubin 0.2 ?Lipase 30 ? ? ?Past Medical History:  ?Diagnosis Date  ? Blood transfusion without reported diagnosis 03/2019  ? Cataract   ? bilateral sx  ? Headache   ? Iron deficiency anemia   ? on meds  ? LBBB (left bundle branch block) 2020  ? Menorrhagia   ? Seasonal allergies   ? Seizures ([place])   ? age 70- related to headaches per patient  ? Vitamin B 12 deficiency   ? ? ?Past Surgical History:  ?Procedure Laterality Date  ? APPENDECTOMY    ? age 36  ? BREAST BIOPSY    ? BREAST SURGERY Right   ? blocked milk duct  ? CATARACT EXTRACTION, BILATERAL    ? CYST EXCISION    ? Forehead  ? DILITATION & CURRETTAGE/HYSTROSCOPY WITH NOVASURE ABLATION N/A 05/10/2020  ? Procedure: DILATATION & CURETTAGE/HYSTEROSCOPY WITH NOVASURE ABLATION;  Surgeon: Griffin Basil, MD;  Location: [facility];  Service: Gynecology;  Laterality: N/A;  ? TUBAL LIGATION  1998  ? WISDOM TOOTH EXTRACTION    ? ? ?Family History  ?Problem Relation Age of Onset  ? Colon polyps Mother   ? Hypertension Mother   ? Miscarriages / [place] Mother   ? Hyperlipidemia Mother   ? Diabetes Mother   ? Hypertension Father   ? Heart disease Father   ? Diabetes Father   ? Hypertension Sister   ? Miscarriages / Stillbirths Sister   ? Diabetes Sister   ? Hypertension Brother   ? Breast cancer Maternal Aunt   ? Hypertension Maternal Grandmother   ? Hypertension Maternal Grandfather   ? Colon  cancer Neg Hx   ? Esophageal cancer Neg Hx   ? Rectal cancer Neg Hx   ? Stomach cancer Neg Hx   ? ? ?Social:  reports that she has never smoked. She has never used smokeless tobacco. She reports that she does not currently use alcohol. She reports that she does not use drugs. ? ?Allergies:  ?Allergies  ?Allergen Reactions  ? Aspirin Hives and Swelling  ? ? ?Medications: I have reviewed the patient's current medications. ? ?Results for orders placed or performed during the hospital encounter of 08/17/21 (from the past 48 hour(s))  ?Comprehensive metabolic panel     Status: Abnormal  ? Collection Time: 08/17/21  7:47 AM  ?Result Value Ref Range  ? Sodium 135 135 - 145 mmol/L  ? Potassium 3.9 3.5 - 5.1 mmol/L  ? Chloride 105 98 - 111 mmol/L  ? CO2 24 22 - 32 mmol/L  ? Glucose, Bld 126 (H) 70 - 99 mg/dL  ?  Comment: Glucose reference range applies only to samples taken after fasting for at least 8 hours.  ? BUN 14 6 - 20 mg/dL  ? Creatinine, Ser 0.67 0.44 - 1.00 mg/dL  ? Calcium 8.6 (L) 8.9 - 10.3 mg/dL  ?  Total Protein 6.1 (L) 6.5 - 8.1 g/dL  ? Albumin 3.4 (L) 3.5 - 5.0 g/dL  ? AST 18 15 - 41 U/L  ? ALT 21 0 - 44 U/L  ? Alkaline Phosphatase 49 38 - 126 U/L  ? Total Bilirubin 0.2 (L) 0.3 - 1.2 mg/dL  ? GFR, Estimated >60 >60 mL/min  ?  Comment: (NOTE) ?Calculated using the CKD-EPI Creatinine Equation (2021) ?  ? Anion gap 6 5 - 15  ?  Comment: Performed at [facility], [place] [address], [place], [place] [address]  ?CBC with Differential     Status: Abnormal  ? Collection Time: 08/17/21  7:47 AM  ?Result Value Ref Range  ? WBC 10.0 4.0 - 10.5 K/uL  ? RBC 5.22 (H) 3.87 - 5.11 MIL/uL  ? Hemoglobin 14.7 12.0 - 15.0 g/dL  ? HCT 43.1 36.0 - 46.0 %  ? MCV 82.6 80.0 - 100.0 fL  ? MCH 28.2 26.0 - 34.0 pg  ? MCHC 34.1 30.0 - 36.0 g/dL  ? RDW 15.9 (H) 11.5 - 15.5 %  ? Platelets 282 150 - 400 K/uL  ? nRBC 0.0 0.0 - 0.2 %  ? Neutrophils Relative % 83 %  ? Neutro Abs 8.4 (H) 1.7 - 7.7 K/uL  ? Lymphocytes Relative 13  %  ? Lymphs Abs 1.3 0.7 - 4.0 K/uL  ? Monocytes Relative 3 %  ? Monocytes Absolute 0.3 0.1 - 1.0 K/uL  ? Eosinophils Relative 0 %  ? Eosinophils Absolute 0.0 0.0 - 0.5 K/uL  ? Basophils Relative 0 %  ? Basophils Absolute 0.0 0.0 - 0.1 K/uL  ? Immature Granulocytes 1 %  ? Abs Immature Granulocytes 0.05 0.00 - 0.07 K/uL  ?  Comment: Performed at [facility], [place] [address], [place], [place] [address]  ?Lipase, blood     Status: None  ? Collection Time: 08/17/21  7:47 AM  ?Result Value Ref Range  ? Lipase 30 11 - 51 U/L  ?  Comment: Performed at [facility], [place] [address], [place], [place] [address]  ? ? ?US Abdomen Limited RUQ (LIVER/GB) ? ?Result Date: 08/17/2021 ?CLINICAL DATA:  Epigastric pain EXAM: ULTRASOUND ABDOMEN LIMITED RIGHT UPPER QUADRANT COMPARISON:  None. FINDINGS: Gallbladder: There are multiple gallbladder stones. There is mild wall thickening measuring 3 mm. There is no fluid around the gallbladder. Technologist observed tenderness over the gallbladder. Common bile duct: Diameter: 10 mm. There is no dilation of intrahepatic bile ducts. Distal common bile duct is obscured by bowel gas. Liver: No focal lesion identified. Within normal limits in parenchymal echogenicity. Portal vein is patent on color Doppler imaging with normal direction of blood flow towards the liver. Other: None. IMPRESSION: Gallbladder stones. There is wall thickening in gallbladder which may be due to acute or chronic cholecystitis. Technologist observed tenderness over the gallbladder. There are no definite imaging signs of acute cholecystitis. Please correlate with clinical symptoms and laboratory findings. There is prominence of proximal common bile duct measuring 10 mm without intrahepatic duct dilation. Distal common bile duct is obscured by bowel gas. Please correlate with clinical symptoms laboratory findings for evidence of distal bile duct obstruction. Electronically Signed   By:  Elmer Picker M.D.   On: 08/17/2021 10:43   ? ?ROS - all of the below systems have been reviewed with the patient and positives are indicated with bold text ?General: chills, fever or night sweats ?Eyes: blurry vision or double vision ?ENT: epistaxis or  sore throat ?Allergy/Immunology: itchy/watery eyes or nasal congestion ?Hematologic/Lymphatic: bleeding problems, blood clots or swollen lymph nodes ?Endocrine: temperature intolerance or unexpected weight changes ?Breast: new or changing breast lumps or nipple discharge ?Resp: cough, shortness of breath, or wheezing ?CV: chest pain or dyspnea on exertion ?GI: as per HPI ?GU: dysuria, trouble voiding, or hematuria ?MSK: joint pain or joint stiffness ?Neuro: TIA or stroke symptoms ?Derm: pruritus and skin lesion changes ?Psych: anxiety and depression ? ?PE ?Blood pressure 104/77, pulse 88, temperature (!) 97.5 ?F (36.4 ?C), temperature source Oral, resp. rate (!) 32, height '5\' 9"'$  (1.753 m), weight 81.6 kg, last menstrual period 07/16/2021, SpO2 100 %. ?Constitutional: NAD; conversant; no deformities ?Eyes: Moist conjunctiva; no lid lag; anicteric; PERRL ?Neck: Trachea midline; no thyromegaly ?Lungs: Normal respiratory effort; no tactile fremitus ?CV: RRR; no palpable thrills; no pitting edema ?GI: Abd soft, focally ttp in RUQ ; no rebound/guarding; no tenderness elsewhere; nondistended; no palpable hepatosplenomegaly ?MSK: Normal range of motion of extremities; no clubbing/cyanosis ?Psychiatric: Appropriate affect; alert and oriented x3 ?Lymphatic: No palpable cervical or axillary lymphadenopathy ? ?Results for orders placed or performed during the hospital encounter of 08/17/21 (from the past 48 hour(s))  ?Comprehensive metabolic panel     Status: Abnormal  ? Collection Time: 08/17/21  7:47 AM  ?Result Value Ref Range  ? Sodium 135 135 - 145 mmol/L  ? Potassium 3.9 3.5 - 5.1 mmol/L  ? Chloride 105 98 - 111 mmol/L  ? CO2 24 22 - 32 mmol/L  ? Glucose, Bld 126 (H)  70 - 99 mg/dL  ?  Comment: Glucose reference range applies only to samples taken after fasting for at least 8 hours.  ? BUN 14 6 - 20 mg/dL  ? Creatinine, Ser 0.67 0.44 - 1.00 mg/dL  ? Calcium 8.6 (L) 8.9

## 2021-08-17 NOTE — ED Provider Notes (Signed)
?[facility] DEPT ?Provider Note ? ? ?CSN: [id number] ?Arrival date & time: 08/17/21  3875 ? ?  ? ?History ? ?Chief Complaint  ?Patient presents with  ? Abdominal Pain  ? ? ?Sydnei Ohaver Nienaber is a 51 y.o. female. ? ?HPI ? ?  ?51 year old female comes in with chief complaint of abdominal pain. ? ?She indicates periumbilical abdominal pain that woke her up around 3 AM.  She has had nausea with about 5 episodes of emesis since then.  No diarrhea.  Indicates that she did eat outside at a fast food yesterday.  No history of similar symptoms in the past.  No sick contacts.  She has no significant medical history and denies any pelvic pain or UTI-like symptoms. ? ? ?Home Medications ?Prior to Admission medications   ?Medication Sig Start Date End Date Taking? Authorizing Provider  ?BIOTIN PO Take 3 tablets by mouth daily.    [provider]  ?ferrous sulfate 325 (65 FE) MG tablet Take 325 mg by mouth daily with breakfast.    [provider]  ?Multiple Vitamins-Minerals (MULTIPLE VITAMINS/WOMENS PO) Take 2 tablets by mouth daily.    [provider]  ?Omega-3 Fatty Acids (FISH OIL) 1000 MG CPDR Take 1,000 mg by mouth daily.    [provider]  ?vitamin C (ASCORBIC ACID) 250 MG tablet Take 250 mg by mouth daily.    [provider]  ?   ? ?Allergies    ?Aspirin   ? ?Review of Systems   ?Review of Systems  ?All other systems reviewed and are negative. ? ?Physical Exam ?Updated Vital Signs ?BP 125/89   Pulse 88   Temp (!) 97.5 ?F (36.4 ?C) (Oral)   Resp (!) 23   Ht '5\' 9"'$  (1.753 m)   Wt 81.6 kg   LMP 07/16/2021 (Approximate)   SpO2 100%   BMI 26.58 kg/m?  ?Physical Exam ?Vitals and nursing note reviewed.  ?Constitutional:   ?   Appearance: She is well-developed.  ?HENT:  ?   Head: Atraumatic.  ?Cardiovascular:  ?   Rate and Rhythm: Normal rate.  ?Pulmonary:  ?   Effort: Pulmonary effort is normal.  ?Abdominal:  ?   Tenderness: There is abdominal  tenderness in the right upper quadrant, epigastric area and periumbilical area. There is no guarding or rebound. Negative signs include Murphy's sign.  ?Musculoskeletal:  ?   Cervical back: Normal range of motion and neck supple.  ?Skin: ?   General: Skin is warm and dry.  ?Neurological:  ?   Mental Status: She is alert and oriented to person, place, and time.  ? ? ?ED Results / Procedures / Treatments   ?Labs ?(all labs ordered are listed, but only abnormal results are displayed) ?Labs Reviewed  ?COMPREHENSIVE METABOLIC PANEL - Abnormal; Notable for the following components:  ?    Result Value  ? Glucose, Bld 126 (*)   ? Calcium 8.6 (*)   ? Total Protein 6.1 (*)   ? Albumin 3.4 (*)   ? Total Bilirubin 0.2 (*)   ? All other components within normal limits  ?CBC WITH DIFFERENTIAL/PLATELET - Abnormal; Notable for the following components:  ? RBC 5.22 (*)   ? RDW 15.9 (*)   ? Neutro Abs 8.4 (*)   ? All other components within normal limits  ?LIPASE, BLOOD  ?URINALYSIS, ROUTINE W REFLEX MICROSCOPIC  ? ? ?EKG ?None ? ?Radiology ?US Abdomen Limited RUQ (LIVER/GB) ? ?Result Date: 08/17/2021 ?CLINICAL DATA:  Epigastric pain EXAM: ULTRASOUND ABDOMEN LIMITED RIGHT UPPER QUADRANT COMPARISON:  None. FINDINGS: Gallbladder: There are multiple gallbladder stones. There is mild wall thickening measuring 3 mm. There is no fluid around the gallbladder. Technologist observed tenderness over the gallbladder. Common bile duct: Diameter: 10 mm. There is no dilation of intrahepatic bile ducts. Distal common bile duct is obscured by bowel gas. Liver: No focal lesion identified. Within normal limits in parenchymal echogenicity. Portal vein is patent on color Doppler imaging with normal direction of blood flow towards the liver. Other: None. IMPRESSION: Gallbladder stones. There is wall thickening in gallbladder which may be due to acute or chronic cholecystitis. Technologist observed tenderness over the gallbladder. There are no definite  imaging signs of acute cholecystitis. Please correlate with clinical symptoms and laboratory findings. There is prominence of proximal common bile duct measuring 10 mm without intrahepatic duct dilation. Distal common bile duct is obscured by bowel gas. Please correlate with clinical symptoms laboratory findings for evidence of distal bile duct obstruction. Electronically Signed   By: Elmer Picker M.D.   On: 08/17/2021 10:43   ? ?Procedures ?Procedures  ? ? ?Medications Ordered in ED ?Medications  ?lactated ringers bolus 1,000 mL (0 mLs Intravenous Stopped 08/17/21 0923)  ?morphine (PF) 4 MG/ML injection 4 mg (4 mg Intravenous Given 08/17/21 0818)  ?ondansetron [facility]) injection 4 mg (4 mg Intravenous Given 08/17/21 0817)  ?HYDROmorphone (DILAUDID) injection 1 mg (1 mg Intravenous Given 08/17/21 1020)  ?ondansetron (ZOFRAN) injection 4 mg (4 mg Intravenous Given 08/17/21 1019)  ? ? ?ED Course/ Medical Decision Making/ A&P ?  ?                        ?Medical Decision Making ?Amount and/or Complexity of Data Reviewed ?Labs: ordered. ?Radiology: ordered. ? ?Risk ?Prescription drug management. ?Decision regarding hospitalization. ? ? ? ?This patient presents to the ED with chief complaint(s) of abdominal pain with no concerning pertinent past medical history or surgical history. ? ?The complaint involves an extensive differential diagnosis and treatment options and also carries with it a high risk of complications and morbidity.   ? ?The differential diagnosis includes : ?DDx includes: ?Pancreatitis ?Hepatobiliary pathology including cholecystitis ?Gastritis/PUD ?SBO ?Viral gastroenteritis ?Food poisoning ? ?The initial plan is to order basic blood work and to treat patient with IV pain medication and nausea medication along with IV hydration for her severe pain with vomiting. ? ?Additional Tests and treatment considered: ?Considered CT scan.  However patient does not have acute peritonitis.  At this time, we will  order lab work-up and reassess the patient before deciding on any advanced imaging. ? ?Reassessment and review (also see workup area): ?Lab Tests: ?I Ordered, and personally interpreted labs.  The pertinent results include: CBC, CMP and lipase which are normal. ?On reassessment, patient continues to have significant discomfort.  IV pain medication had minimal effect.  On repeat exam, patient is noted to have guarding over the right upper quadrant, but no Murphy's.  We will proceed with ultrasound right upper quadrant ? ?Imaging Studies: ?I ordered and independently visualized and interpreted the following imaging Ultrasound of the abdomen   which showed clear evidence of gallstones. ?Radiologist appreciate some gallbladder wall thickening as well, and patient had sonographic Murphy's. ?The interpretation of the imaging was limited to assessing for emergent pathology, for which purpose it was ordered. ? ?Consultations Obtained: ?I requested consultation with the general surgery team, and discussed  findings as well as pertinent  plan - they recommend: Admission to the hospital for symptomatic cholelithiasis.  Plan will be for patient to get cholecystectomy. ? ?Patient made aware of this recommendation. ? ?Medicines ordered and prescription drug management: ?I ordered the following medications IV Zofran, morphine and then hydromorphone for pain control ?  ?Reevaluation of the patient after these medicines showed that the patient    stayed the same ? ? ?Cardiac Monitoring: ?The patient was maintained on a cardiac monitor.  I personally viewed and interpreted the cardiac monitor which showed an underlying rhythm of:  sinus rhythm ? ?Complexity of problems addressed: ?Patient?s presentation is most consistent with  acute illness/injury with systemic symptoms ?During patient's assessment ? ?Disposition: ?After consideration of the diagnostic results and the patient?s response to treatment,  ?I feel that the patent would  benefit from admission   .  ? ? ?Final Clinical Impression(s) / ED Diagnoses ?Final diagnoses:  ?Symptomatic cholelithiasis  ? ? ?Rx / DC Orders ?ED Discharge Orders   ? ? None  ? ?  ? ? ?  ?Varney Biles, MD ?08/17/21 11

## 2021-08-18 ENCOUNTER — Encounter (HOSPITAL_COMMUNITY): Payer: Self-pay | Admitting: Surgery

## 2021-08-18 NOTE — Anesthesia Postprocedure Evaluation (Signed)
Anesthesia Post Note ? ?Patient: Meghan Dawson ? ?Procedure(s) Performed: LAPAROSCOPIC CHOLECYSTECTOMY WITH ICG ? ?  ? ?Patient location during evaluation: PACU ?Anesthesia Type: General ?Level of consciousness: awake and alert ?Pain management: pain level controlled ?Vital Signs Assessment: post-procedure vital signs reviewed and stable ?Respiratory status: spontaneous breathing, nonlabored ventilation, respiratory function stable and patient connected to nasal cannula oxygen ?Cardiovascular status: blood pressure returned to baseline and stable ?Postop Assessment: no apparent nausea or vomiting ?Anesthetic complications: no ? ? ?No notable events documented. ? ?Last Vitals:  ?Vitals:  ? 08/17/21 1541 08/17/21 1545  ?BP: 122/80 127/75  ?Pulse: 97 88  ?Resp: 15   ?Temp:    ?SpO2: 100% 100%  ?  ?Last Pain:  ?Vitals:  ? 08/17/21 1545  ?TempSrc:   ?PainSc: 4   ? ? ?  ?  ?  ?  ?  ?  ? ?Russel Morain S ? ? ? ? ?

## 2021-08-21 ENCOUNTER — Encounter: Payer: Self-pay | Admitting: Emergency Medicine

## 2021-08-21 ENCOUNTER — Ambulatory Visit (INDEPENDENT_AMBULATORY_CARE_PROVIDER_SITE_OTHER): Payer: 59 | Admitting: Emergency Medicine

## 2021-08-21 VITALS — BP 120/78 | HR 85 | Temp 97.8°F | Ht 69.0 in | Wt 187.4 lb

## 2021-08-21 DIAGNOSIS — D509 Iron deficiency anemia, unspecified: Secondary | ICD-10-CM

## 2021-08-21 DIAGNOSIS — Z1231 Encounter for screening mammogram for malignant neoplasm of breast: Secondary | ICD-10-CM

## 2021-08-21 DIAGNOSIS — Z9049 Acquired absence of other specified parts of digestive tract: Secondary | ICD-10-CM | POA: Diagnosis not present

## 2021-08-21 DIAGNOSIS — Z09 Encounter for follow-up examination after completed treatment for conditions other than malignant neoplasm: Secondary | ICD-10-CM | POA: Diagnosis not present

## 2021-08-21 LAB — SURGICAL PATHOLOGY

## 2021-08-21 NOTE — Assessment & Plan Note (Signed)
Healing well.  No complications.  Able to eat and drink.  Afebrile. ?Progressing well.  Pain well-tolerated. ?

## 2021-08-21 NOTE — Patient Instructions (Signed)

## 2021-08-21 NOTE — Assessment & Plan Note (Signed)
Stable.  Normal H&H last time, couple days ago. ?

## 2021-08-21 NOTE — Progress Notes (Signed)
Meghan Dawson ?51 y.o. ? ? ?Chief Complaint  ?Patient presents with  ? Follow-up  ? ? ?HISTORY OF PRESENT ILLNESS: ?This is a 51 y.o. female here for follow-up of lap cholecystectomy done on 08/17/2021. ?Recovering well.  Still has some soreness to surgical sites. ?Able to eat and drink.  Denies nausea or vomiting.  Denies fever or chills. ?Has history of iron deficiency anemia but last H&H within normal limits. ?Lab Results  ?Component Value Date  ? WBC 10.0 08/17/2021  ? HGB 14.7 08/17/2021  ? HCT 43.1 08/17/2021  ? MCV 82.6 08/17/2021  ? PLT 282 08/17/2021  ?Needs mammogram referral. ?No other complaints or medical concerns today. ? ? ?HPI ? ? ?Prior to Admission medications   ?Medication Sig Start Date End Date Taking? Authorizing Provider  ?BIOTIN PO Take 3 tablets by mouth daily.    [provider]  ?ferrous sulfate 325 (65 FE) MG tablet Take 325 mg by mouth daily with breakfast.    [provider]  ?Multiple Vitamins-Minerals (MULTIPLE VITAMINS/WOMENS PO) Take 2 tablets by mouth daily.    [provider]  ?Omega-3 Fatty Acids (FISH OIL) 1000 MG CPDR Take 1,000 mg by mouth daily.    [provider]  ?oxyCODONE (OXY IR/ROXICODONE) 5 MG immediate release tablet Take 1 tablet (5 mg total) by mouth every 6 (six) hours as needed for severe pain. 08/17/21   Meuth, Brooke A, PA-C  ?vitamin C (ASCORBIC ACID) 250 MG tablet Take 250 mg by mouth daily.    [provider]  ? ? ?Allergies  ?Allergen Reactions  ? Aspirin Hives and Swelling  ? ? ?Patient Active Problem List  ? Diagnosis Date Noted  ? COVID-19 04/24/2021  ? Chronic iron deficiency anemia 12/13/2017  ? ? ?Past Medical History:  ?Diagnosis Date  ? Blood transfusion without reported diagnosis 03/2019  ? Cataract   ? bilateral sx  ? Headache   ? Iron deficiency anemia   ? on meds  ? LBBB (left bundle branch block) 2020  ? Menorrhagia   ? Seasonal allergies   ? Seizures ([place])   ? age 74- related to headaches per patient  ?  Vitamin B 12 deficiency   ? ? ?Past Surgical History:  ?Procedure Laterality Date  ? APPENDECTOMY    ? age 52  ? BREAST BIOPSY    ? BREAST SURGERY Right   ? blocked milk duct  ? CATARACT EXTRACTION, BILATERAL    ? CHOLECYSTECTOMY N/A 08/17/2021  ? Procedure: LAPAROSCOPIC CHOLECYSTECTOMY WITH ICG;  Surgeon: Ileana Roup, MD;  Location: [facility] ORS;  Service: General;  Laterality: N/A;  ? CYST EXCISION    ? Forehead  ? DILITATION & CURRETTAGE/HYSTROSCOPY WITH NOVASURE ABLATION N/A 05/10/2020  ? Procedure: DILATATION & CURETTAGE/HYSTEROSCOPY WITH NOVASURE ABLATION;  Surgeon: Griffin Basil, MD;  Location: [facility] ORS;  Service: Gynecology;  Laterality: N/A;  ? TUBAL LIGATION  1998  ? WISDOM TOOTH EXTRACTION    ? ? ?Social History  ? ?Socioeconomic History  ? Marital status: Married  ?  Spouse name: Not on file  ? Number of children: Not on file  ? Years of education: Not on file  ? Highest education level: Not on file  ?Occupational History  ? Not on file  ?Tobacco Use  ? Smoking status: Never  ? Smokeless tobacco: Never  ?Vaping Use  ? Vaping Use: Never used  ?Substance and Sexual Activity  ? Alcohol use: Not Currently  ?  Comment: occ  ?  Drug use: Never  ? Sexual activity: Yes  ?  Partners: Male  ?  Birth control/protection: Surgical  ?Other Topics Concern  ? Not on file  ?Social History Narrative  ? Not on file  ? ?Social Determinants of Health  ? ?Financial Resource Strain: Not on file  ?Food Insecurity: Not on file  ?Transportation Needs: Not on file  ?Physical Activity: Not on file  ?Stress: Not on file  ?Social Connections: Not on file  ?Intimate Partner Violence: Not on file  ? ? ?Family History  ?Problem Relation Age of Onset  ? Colon polyps Mother   ? Hypertension Mother   ? Miscarriages / [place] Mother   ? Hyperlipidemia Mother   ? Diabetes Mother   ? Hypertension Father   ? Heart disease Father   ? Diabetes Father   ? Hypertension Sister   ? Miscarriages / Stillbirths Sister   ? Diabetes Sister   ?  Hypertension Brother   ? Breast cancer Maternal Aunt   ? Hypertension Maternal Grandmother   ? Hypertension Maternal Grandfather   ? Colon cancer Neg Hx   ? Esophageal cancer Neg Hx   ? Rectal cancer Neg Hx   ? Stomach cancer Neg Hx   ? ? ? ?Review of Systems  ?Constitutional: Negative.  Negative for chills and fever.  ?HENT: Negative.  Negative for congestion and sore throat.   ?Respiratory: Negative.  Negative for cough and shortness of breath.   ?Cardiovascular: Negative.  Negative for chest pain and palpitations.  ?Gastrointestinal:  Negative for abdominal pain, diarrhea, nausea and vomiting.  ?Genitourinary: Negative.   ?Skin: Negative.  Negative for rash.  ?Neurological: Negative.  Negative for dizziness and headaches.  ?All other systems reviewed and are negative. ? ?Today's Vitals  ? 08/21/21 0759  ?BP: 120/78  ?Pulse: 85  ?Temp: 97.8 ?F (36.6 ?C)  ?TempSrc: Oral  ?SpO2: 98%  ?Weight: 187 lb 6 oz (85 kg)  ?Height: '5\' 9"'$  (1.753 m)  ? ?Body mass index is 27.67 kg/m?. ? ?Physical Exam ?Vitals reviewed.  ?Constitutional:   ?   Appearance: Normal appearance.  ?HENT:  ?   Head: Normocephalic.  ?Eyes:  ?   Extraocular Movements: Extraocular movements intact.  ?   Pupils: Pupils are equal, round, and reactive to light.  ?Cardiovascular:  ?   Rate and Rhythm: Normal rate and regular rhythm.  ?   Pulses: Normal pulses.  ?   Heart sounds: Normal heart sounds.  ?Pulmonary:  ?   Effort: Pulmonary effort is normal.  ?   Breath sounds: Normal breath sounds.  ?Abdominal:  ?   Palpations: Abdomen is soft.  ?   Tenderness: There is no abdominal tenderness.  ?   Comments: Surgical wounds healing well without signs of infection  ?Musculoskeletal:     ?   General: Normal range of motion.  ?   Cervical back: No tenderness.  ?Lymphadenopathy:  ?   Cervical: No cervical adenopathy.  ?Skin: ?   General: Skin is warm and dry.  ?   Capillary Refill: Capillary refill takes less than 2 seconds.  ?Neurological:  ?   General: No focal  deficit present.  ?   Mental Status: She is alert and oriented to person, place, and time.  ?Psychiatric:     ?   Mood and Affect: Mood normal.     ?   Behavior: Behavior normal.  ? ? ? ?ASSESSMENT & PLAN: ?Problem List Items Addressed This Visit   ? ?  ?  Other  ? Chronic iron deficiency anemia  ?  Stable.  Normal H&H last time, couple days ago. ?  ?  ? Status post cholecystectomy  ?  Healing well.  No complications.  Able to eat and drink.  Afebrile. ?Progressing well.  Pain well-tolerated. ?  ?  ? ?Other Visit Diagnoses   ? ? Hospital discharge follow-up    -  Primary  ? Encounter for screening mammogram for malignant neoplasm of breast      ? Relevant Orders  ? MM Digital Screening  ? ?  ? ?Patient Instructions  ?Health Maintenance, Female ?Adopting a healthy lifestyle and getting preventive care are important in promoting health and wellness. Ask your health care provider about: ?The right schedule for you to have regular tests and exams. ?Things you can do on your own to prevent diseases and keep yourself healthy. ?What should I know about diet, weight, and exercise? ?Eat a healthy diet ? ?Eat a diet that includes plenty of vegetables, fruits, low-fat dairy products, and lean protein. ?Do not eat a lot of foods that are high in solid fats, added sugars, or sodium. ?Maintain a healthy weight ?Body mass index (BMI) is used to identify weight problems. It estimates body fat based on height and weight. Your health care provider can help determine your BMI and help you achieve or maintain a healthy weight. ?Get regular exercise ?Get regular exercise. This is one of the most important things you can do for your health. Most adults should: ?Exercise for at least 150 minutes each week. The exercise should increase your heart rate and make you sweat (moderate-intensity exercise). ?Do strengthening exercises at least twice a week. This is in addition to the moderate-intensity exercise. ?Spend less time sitting. Even  light physical activity can be beneficial. ?Watch cholesterol and blood lipids ?Have your blood tested for lipids and cholesterol at 50 years of age, then have this test every 5 years. ?Have your cholesterol le

## 2021-10-18 ENCOUNTER — Telehealth: Payer: Self-pay | Admitting: Hematology and Oncology

## 2021-10-18 NOTE — Telephone Encounter (Signed)
Called patient regarding upcoming appointments, left a voicemail. 

## 2021-11-08 IMAGING — US US PELVIS COMPLETE WITH TRANSVAGINAL
1 series · 15 of 25 positions shown · non-contrast
Comparison: None

CLINICAL DATA: Menorrhagia, irregular cycle, LMP 03/25/2020;
anemia; prior tubal ligation

EXAM:
TRANSABDOMINAL AND TRANSVAGINAL ULTRASOUND OF PELVIS
TECHNIQUE: Both transabdominal and transvaginal ultrasound examinations of the
pelvis were performed. Transabdominal technique was performed for
global imaging of the pelvis including uterus, ovaries, adnexal
regions, and pelvic cul-de-sac. It was necessary to proceed with
endovaginal exam following the transabdominal exam to visualize the
cervix/lower uterine segment and RIGHT ovary.

[Series 1: us pelvis complete with transvaginal · 109 acquisitions, 15 frames shown]
[im 1/109]
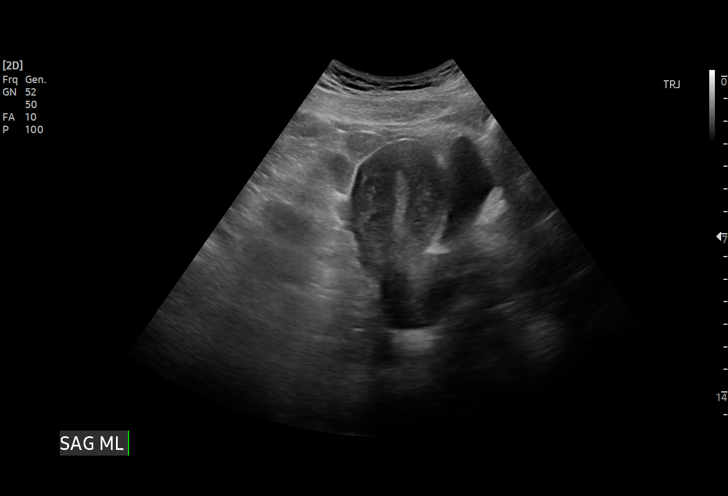
[im 10/109]
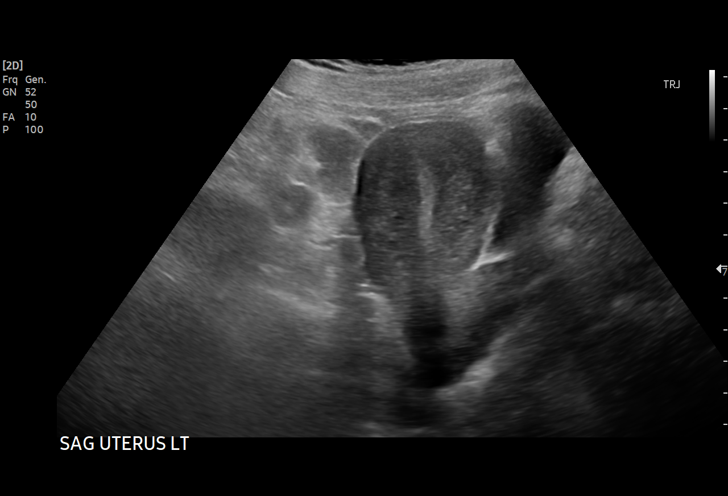
[im 19/109]
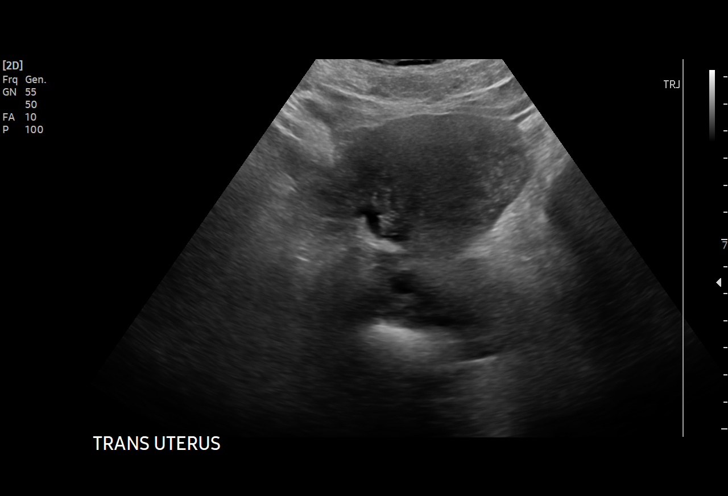
[im 23/109]
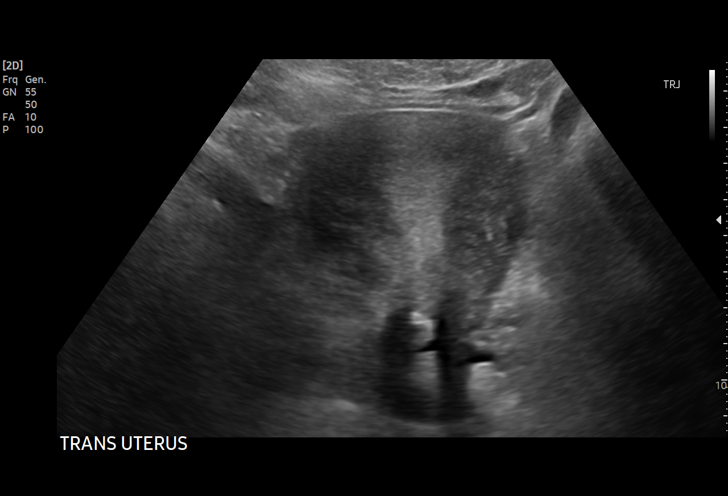
[im 32/109]
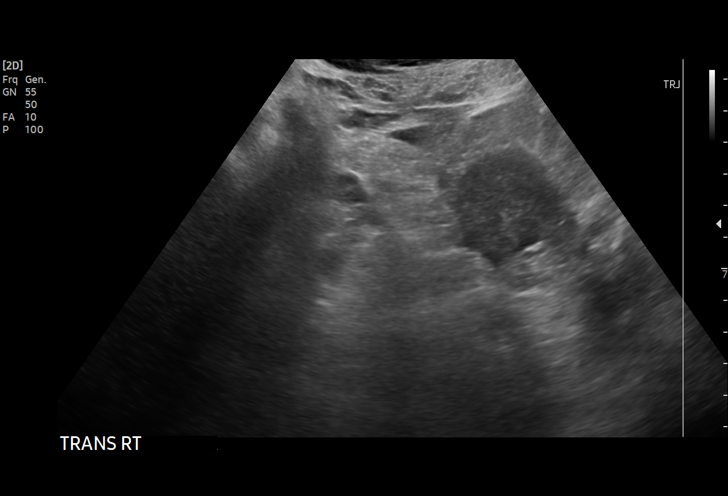
[im 41/109]
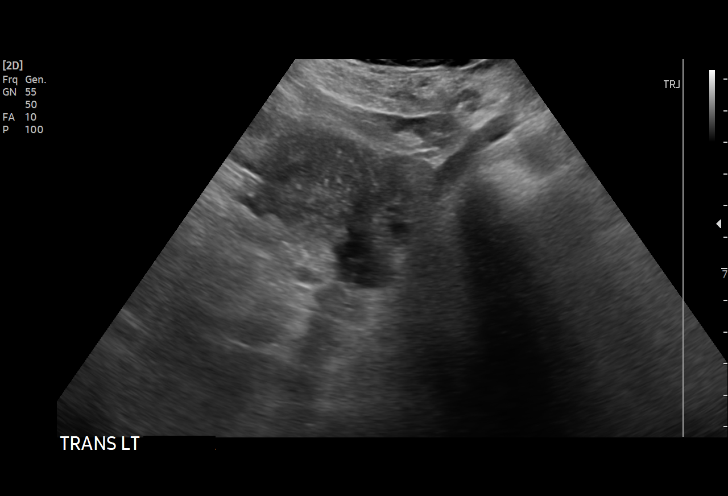
[im 46/109]
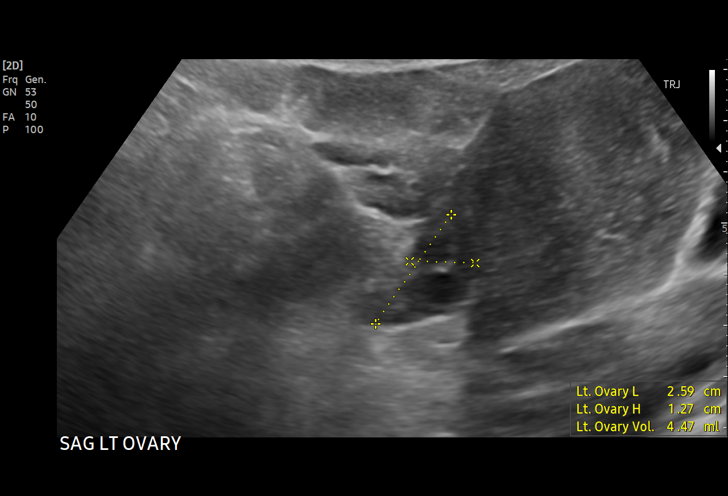
[im 55/109]
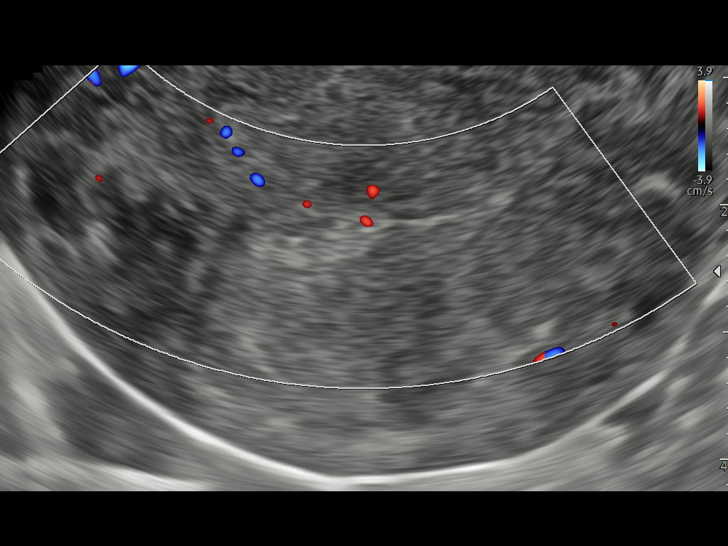
[im 64/109]
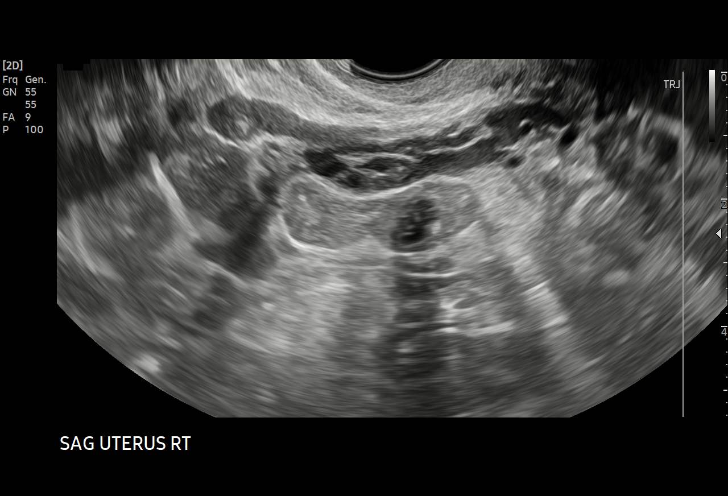
[im 68/109]
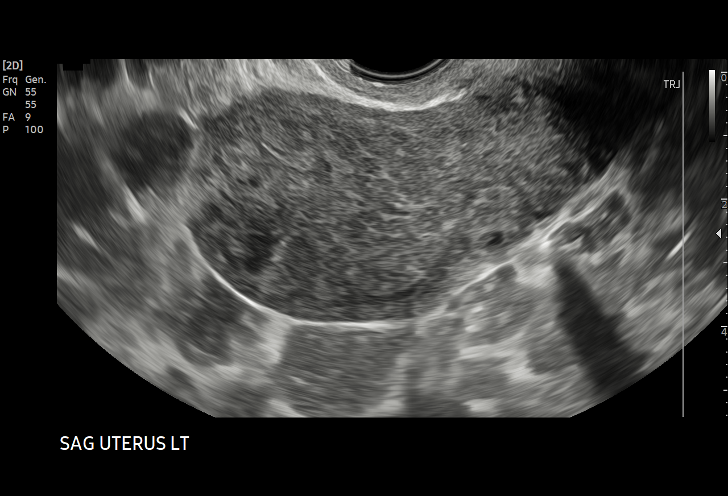
[im 77/109]
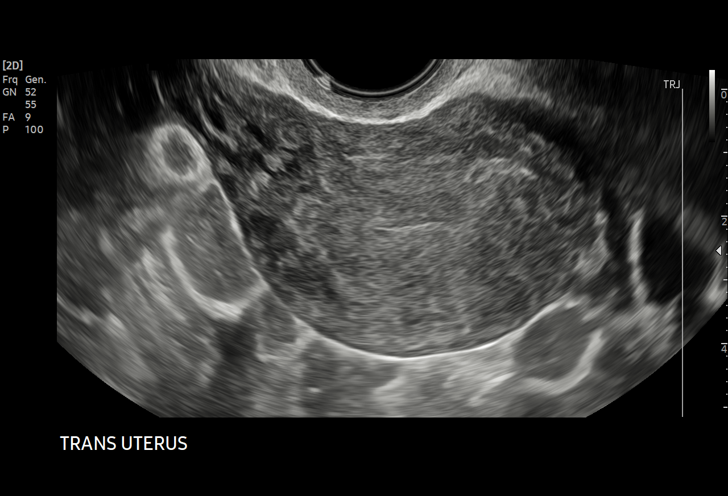
[im 86/109]
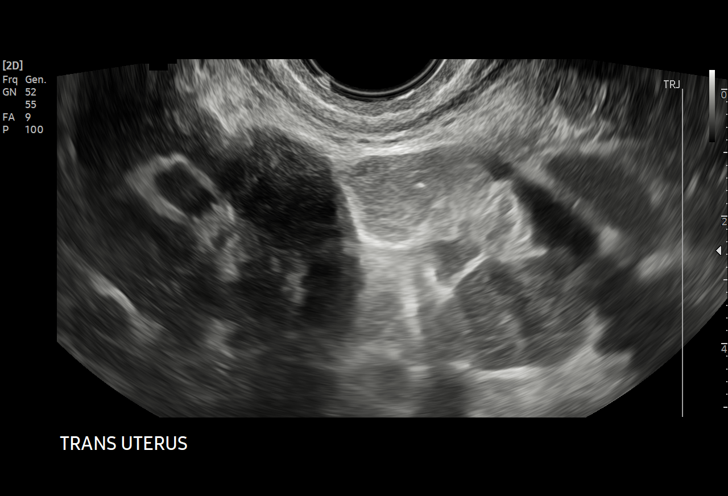
[im 91/109]
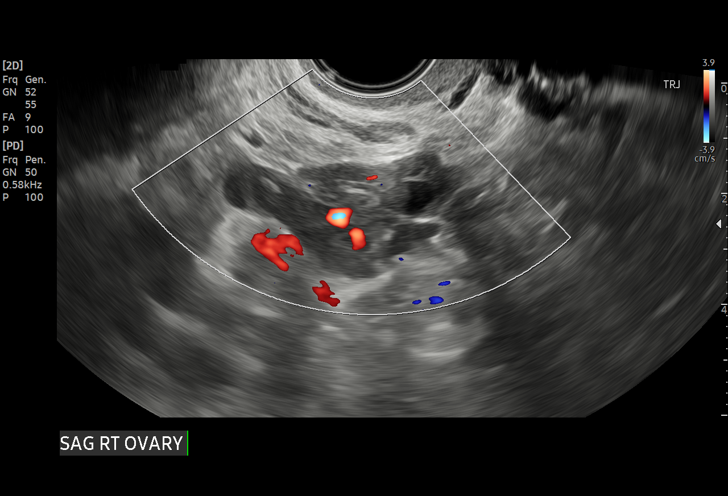
[im 100/109]
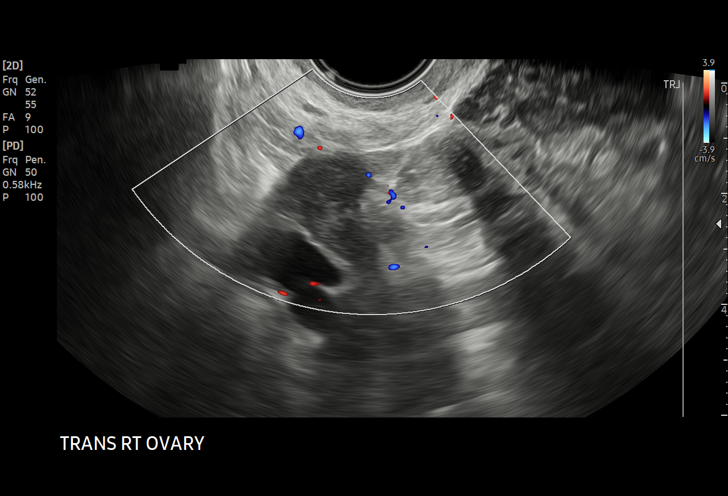
[im 109/109]
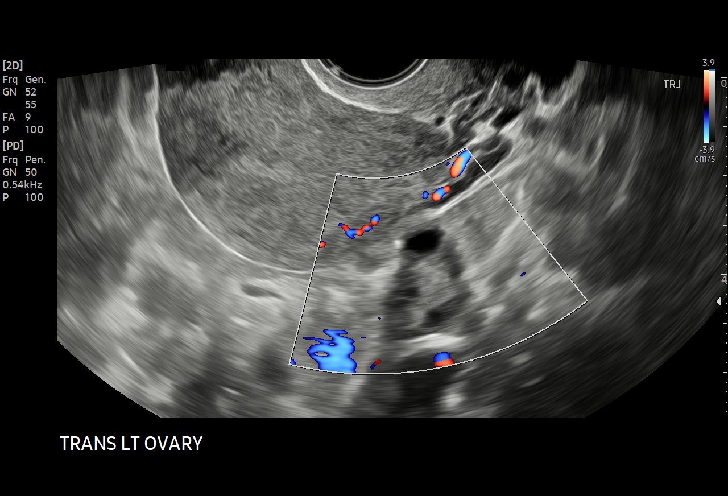

[15 of 25 positions shown; findings below may reference images not displayed]

FINDINGS: Uterus

Measurements: 7.7 x 4.5 x 5.8 cm = volume: 104 mL. Anteverted. Focal
mass at RIGHT fundus consistent with subserosal leiomyoma 3.0 x
x 2.5 cm. No additional masses.

Endometrium

Thickness: 6 mm. Small hyperechoic lenticular nodule identified at
upper uterine segment endometrial canal to the RIGHT, 9 x 4 x 7 mm,
cannot exclude polyp. No endometrial fluid.

Right ovary

Measurements: 2.7 x 1.1 x 2.5 cm = volume: 3.8 mL. Normal morphology
without mass

Left ovary

Measurements: 3.1 x 1.9 x 1.9 cm = volume: 5.5 mL. Normal morphology
without mass

Other findings

No free pelvic fluid.  No adnexal masses.
IMPRESSION: 3.0 cm diameter subserosal leiomyoma at RIGHT fundus.

9 x 4 x 7 mm hyperechoic lenticular nodule at upper uterine segment
endometrial canal to the RIGHT, cannot exclude endometrial polyp;
consider sonohysterogram for further evaluation, prior to
hysteroscopy or endometrial biopsy.

## 2021-11-16 ENCOUNTER — Other Ambulatory Visit: Payer: Self-pay

## 2021-11-16 ENCOUNTER — Inpatient Hospital Stay: Payer: 59 | Attending: Hematology and Oncology

## 2021-11-16 ENCOUNTER — Other Ambulatory Visit: Payer: Self-pay | Admitting: Hematology and Oncology

## 2021-11-16 ENCOUNTER — Inpatient Hospital Stay (HOSPITAL_BASED_OUTPATIENT_CLINIC_OR_DEPARTMENT_OTHER): Payer: 59 | Admitting: Hematology and Oncology

## 2021-11-16 VITALS — BP 121/71 | HR 78 | Temp 97.8°F | Resp 18 | Ht 69.0 in | Wt 181.7 lb

## 2021-11-16 DIAGNOSIS — Z803 Family history of malignant neoplasm of breast: Secondary | ICD-10-CM | POA: Diagnosis not present

## 2021-11-16 DIAGNOSIS — E538 Deficiency of other specified B group vitamins: Secondary | ICD-10-CM | POA: Diagnosis not present

## 2021-11-16 DIAGNOSIS — N939 Abnormal uterine and vaginal bleeding, unspecified: Secondary | ICD-10-CM | POA: Insufficient documentation

## 2021-11-16 DIAGNOSIS — D500 Iron deficiency anemia secondary to blood loss (chronic): Secondary | ICD-10-CM

## 2021-11-16 LAB — CBC WITH DIFFERENTIAL (CANCER CENTER ONLY)
Abs Immature Granulocytes: 0 10*3/uL (ref 0.00–0.07)
Basophils Absolute: 0.1 10*3/uL (ref 0.0–0.1)
Basophils Relative: 1 %
Eosinophils Absolute: 0.2 10*3/uL (ref 0.0–0.5)
Eosinophils Relative: 5 %
HCT: 40.6 % (ref 36.0–46.0)
Hemoglobin: 13.9 g/dL (ref 12.0–15.0)
Immature Granulocytes: 0 %
Lymphocytes Relative: 40 %
Lymphs Abs: 2.1 10*3/uL (ref 0.7–4.0)
MCH: 28.1 pg (ref 26.0–34.0)
MCHC: 34.2 g/dL (ref 30.0–36.0)
MCV: 82.2 fL (ref 80.0–100.0)
Monocytes Absolute: 0.4 10*3/uL (ref 0.1–1.0)
Monocytes Relative: 7 %
Neutro Abs: 2.5 10*3/uL (ref 1.7–7.7)
Neutrophils Relative %: 47 %
Platelet Count: 322 10*3/uL (ref 150–400)
RBC: 4.94 MIL/uL (ref 3.87–5.11)
RDW: 15 % (ref 11.5–15.5)
WBC Count: 5.2 10*3/uL (ref 4.0–10.5)
nRBC: 0 % (ref 0.0–0.2)

## 2021-11-16 LAB — RETIC PANEL
Immature Retic Fract: 8.6 % (ref 2.3–15.9)
RBC.: 4.9 MIL/uL (ref 3.87–5.11)
Retic Count, Absolute: 89.2 10*3/uL (ref 19.0–186.0)
Retic Ct Pct: 1.8 % (ref 0.4–3.1)
Reticulocyte Hemoglobin: 31.6 pg (ref >27.9)

## 2021-11-16 LAB — CMP (CANCER CENTER ONLY)
ALT: 17 U/L (ref 0–44)
AST: 18 U/L (ref 15–41)
Albumin: 4.2 g/dL (ref 3.5–5.0)
Alkaline Phosphatase: 68 U/L (ref 38–126)
Anion gap: 5 (ref 5–15)
BUN: 12 mg/dL (ref 6–20)
CO2: 29 mmol/L (ref 22–32)
Calcium: 9.4 mg/dL (ref 8.9–10.3)
Chloride: 105 mmol/L (ref 98–111)
Creatinine: 0.72 mg/dL (ref 0.44–1.00)
GFR, Estimated: >60 mL/min (ref >60)
Glucose, Bld: 85 mg/dL (ref 70–99)
Potassium: 4.3 mmol/L (ref 3.5–5.1)
Sodium: 139 mmol/L (ref 135–145)
Total Bilirubin: 0.4 mg/dL (ref 0.3–1.2)
Total Protein: 6.9 g/dL (ref 6.5–8.1)

## 2021-11-16 LAB — FOLATE: Folate: 14.4 ng/mL (ref >5.9)

## 2021-11-16 LAB — IRON AND IRON BINDING CAPACITY (CC-WL,HP ONLY)
Iron: 96 ug/dL (ref 28–170)
Saturation Ratios: 21 % (ref 10.4–31.8)
TIBC: 468 ug/dL — ABNORMAL HIGH (ref 250–450)
UIBC: 372 ug/dL (ref 148–442)

## 2021-11-16 LAB — FERRITIN: Ferritin: 23 ng/mL (ref 11–307)

## 2021-11-16 LAB — VITAMIN B12: Vitamin B-12: 361 pg/mL (ref 180–914)

## 2021-11-16 NOTE — Progress Notes (Signed)
[facility] [phone]   Fax:(336) [phone]  PROGRESS NOTE  Patient Care Team: Horald Pollen, MD as PCP - General (Internal Medicine)  Hematological/Oncological History # Iron Deficiency Anemia 1) 12/03/2018: WBC 5.3, Hgb 12.5, Plt 401. MCV 76.2. Iron 28, TIBC 455, Sat 6%, ferritin 7 2) 03/03/2019: WBC 7.0, Hgb 11.8, Plt 345. MCV 76.6. Iron 24, TIBC 436, Sat 6%, ferritin 5 3) 04/29/2019: WBC 7.0, Hgb 11.5, Plt 363. MCV 75.7  4) 05/08/2019: establish care with Dr. Lorenso Courier. Continued on PO iron '325mg'$  daily.  5) 08/17/2019: WBC 5.7, Hgb 11.6, MCV 73.5, Plt 409.  6) 05/17/2021: WBC 5.9, Hgb 13.2, MCV 80, Plt 247 7) 11/16/2021: WBC 5.2, Hgb 13.9, MCV 82.2, Plt 322  Interval History:  Meghan Dawson 51 y.o. female with medical history significant for iron deficiency anemia 2/2 to GYN blood loss who presents for a follow up visit. The patient's last visit was on 05/17/2021. In the interim since the last visit  [name]  has had no major changes in her health.    On exam today [name] notes she has "good days and bad days" in the interim since her last visit.  Reports that she is "lacking and dragging on some days".  She notes today her energy is a 7 out of 10.  She reports that she does take her iron pills which occasionally cause constipation but she drinks prune juice and eats plenty of roughage which has moved along.  She notes her menstrual cycles are very very light.  She notes that she may be spots for 1 day or so and then they go away.  She has been told she is perimenopausal.  She has some very rare issues with lightheadedness.  Overall she feels well.  The patient reports that otherwise she has had no major changes in her health since her last evaluation in December.  A full 10 point ROS is listed below.  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   Vitamin B 12 deficiency     SURGICAL HISTORY: Past Surgical History:  Procedure Laterality Date   APPENDECTOMY     age 89   BREAST BIOPSY     BREAST SURGERY Right    blocked milk duct   CATARACT EXTRACTION, BILATERAL     CHOLECYSTECTOMY N/A 08/17/2021   Procedure: LAPAROSCOPIC CHOLECYSTECTOMY WITH ICG;  Surgeon: Ileana Roup, MD;  Location: [facility];  Service: General;  Laterality: N/A;   CYST EXCISION     Forehead   DILITATION & CURRETTAGE/HYSTROSCOPY WITH NOVASURE ABLATION N/A 05/10/2020   Procedure: DILATATION & CURETTAGE/HYSTEROSCOPY WITH NOVASURE ABLATION;  Surgeon: Griffin Basil, MD;  Location: [facility];  Service: Gynecology;  Laterality: N/A;   TUBAL LIGATION  1998   WISDOM TOOTH EXTRACTION      SOCIAL HISTORY: Social History   Socioeconomic History   Marital status: Married    Spouse name: Not on file   Number of children: Not on file   Years of education: Not on file   Highest education level: Not on file  Occupational History   Not on  file  Tobacco Use   Smoking status: Never   Smokeless tobacco: Never  Vaping Use   Vaping Use: Never used  Substance and Sexual Activity   Alcohol use: Not Currently    Comment: occ   Drug use: Never   Sexual activity: Yes    Partners: Male    Birth control/protection: Surgical  Other Topics Concern   Not on file  Social History Narrative   Not on file   Social Determinants of Health   Financial Resource Strain: Not on file  Food Insecurity: Not on file  Transportation Needs: Not on file  Physical Activity: Not on file  Stress: Not on file  Social Connections: Not on file  Intimate Partner Violence: Not on file    FAMILY HISTORY: Family History  Problem Relation Age of Onset   Colon polyps Mother    Hypertension Mother     Miscarriages / [place] Mother    Hyperlipidemia Mother    Diabetes Mother    Hypertension Father    Heart disease Father    Diabetes Father    Hypertension Sister    Miscarriages / Stillbirths Sister    Diabetes Sister    Hypertension Brother    Breast cancer Maternal Aunt    Hypertension Maternal Grandmother    Hypertension Maternal Grandfather    Colon cancer Neg Hx    Esophageal cancer Neg Hx    Rectal cancer Neg Hx    Stomach cancer Neg Hx     ALLERGIES:  is allergic to aspirin.  MEDICATIONS:  Current Outpatient Medications  Medication Sig Dispense Refill   BIOTIN PO Take 3 tablets by mouth daily.     ferrous sulfate 325 (65 FE) MG tablet Take 325 mg by mouth daily with breakfast.     Multiple Vitamins-Minerals (MULTIPLE VITAMINS/WOMENS PO) Take 2 tablets by mouth daily.     Omega-3 Fatty Acids (FISH OIL) 1000 MG CPDR Take 1,000 mg by mouth daily.     vitamin C (ASCORBIC ACID) 250 MG tablet Take 250 mg by mouth daily.     No current facility-administered medications for this visit.    REVIEW OF SYSTEMS:   Constitutional: ( - ) fevers, ( - )  chills , ( - ) night sweats Eyes: ( - ) blurriness of vision, ( - ) double vision, ( - ) watery eyes Ears, nose, mouth, throat, and face: ( - ) mucositis, ( - ) sore throat Respiratory: ( - ) cough, ( - ) dyspnea, ( - ) wheezes Cardiovascular: ( - ) palpitation, ( - ) chest discomfort, ( - ) lower extremity swelling Gastrointestinal:  ( - ) nausea, ( - ) heartburn, ( - ) change in bowel habits Skin: ( - ) abnormal skin rashes Lymphatics: ( - ) new lymphadenopathy, ( - ) easy bruising Neurological: ( - ) numbness, ( - ) tingling, ( - ) new weaknesses Behavioral/Psych: ( - ) mood change, ( - ) new changes  All other systems were reviewed with the patient and are negative.  PHYSICAL EXAMINATION: ECOG PERFORMANCE STATUS: 1 - Symptomatic but completely ambulatory  Vitals:   11/16/21 1026  BP: 121/71  Pulse: 78  Resp: 18   Temp: 97.8 F (36.6 C)  SpO2: 99%    Filed Weights   11/16/21 1026  Weight: 181 lb 11.2 oz (82.4 kg)     GENERAL: well appearing middle aged [place] American female in NAD  SKIN: skin color, texture, turgor are normal, no rashes  or significant lesions EYES: conjunctiva are pink and non-injected, sclera clear LUNGS: clear to auscultation and percussion with normal breathing effort HEART: regular rate & rhythm and no murmurs and no lower extremity edema ABDOMEN: soft, non-tender, non-distended, normal bowel sounds Musculoskeletal: no cyanosis of digits and no clubbing  PSYCH: alert & oriented x 3, fluent speech NEURO: no focal motor/sensory deficits  LABORATORY DATA:  I have reviewed the data as listed    Latest Ref Rng & Units 11/16/2021    9:43 AM 08/17/2021    7:47 AM 05/17/2021   10:04 AM  CBC  WBC 4.0 - 10.5 K/uL 5.2  10.0  5.9   Hemoglobin 12.0 - 15.0 g/dL 13.9  14.7  13.2   Hematocrit 36.0 - 46.0 % 40.6  43.1  37.6   Platelets 150 - 400 K/uL 322  282  247        Latest Ref Rng & Units 11/16/2021    9:43 AM 08/17/2021    7:47 AM 05/17/2021   10:04 AM  CMP  Glucose 70 - 99 mg/dL 85  126  146   BUN 6 - 20 mg/dL '12  14  13   '$ Creatinine 0.44 - 1.00 mg/dL 0.72  0.67  0.73   Sodium 135 - 145 mmol/L 139  135  139   Potassium 3.5 - 5.1 mmol/L 4.3  3.9  3.8   Chloride 98 - 111 mmol/L 105  105  107   CO2 22 - 32 mmol/L '29  24  23   '$ Calcium 8.9 - 10.3 mg/dL 9.4  8.6  9.1   Total Protein 6.5 - 8.1 g/dL 6.9  6.1  7.3   Total Bilirubin 0.3 - 1.2 mg/dL 0.4  0.2  0.4   Alkaline Phos 38 - 126 U/L 68  49  60   AST 15 - 41 U/L '18  18  14   '$ ALT 0 - 44 U/L '17  21  19    '$ Iron/TIBC/Ferritin/ %Sat    Component Value Date/Time   IRON 96 11/16/2021 0943   TIBC 468 (H) 11/16/2021 0943   FERRITIN 33 05/17/2021 1004   IRONPCTSAT 21 11/16/2021 0943   IRONPCTSAT 6 (L) 03/03/2019 1454    RADIOGRAPHIC STUDIES: None relevant to review.  No results found.  ASSESSMENT &  PLAN Meghan Dawson 51 y.o. female with medical history significant for iron deficiency anemia 2/2 to GYN blood loss who presents for a follow up visit.    In the interim the patient has been evaluated by OB/GYN who notes that she is "too close to menopause" and that no further inventions are required from their standpoint at this time.  There is no clear evidence of any GI bleeding and therefore I do not think colonoscopy would be required at this time.  I would recommend continued PO iron therapy at this time given her normal hemoglobin, however in the event that this does not improve her hemoglobin/symptoms we would need to consider repeat administration of IV iron therapy.  # Iron Deficiency Anemia 2/2 to GYN Blood Loss --continue PO iron supplementation with a source of vitamin C --patient underwent GYN evaluation and reportedly no intervention was recommend as she is relatively close to menopause -If patient were found to be iron deficient or if her labs are dropping, I would recommended the patient receive IV Feraheme '510mg'$  q 7days x 2 doses  --Hgb today 13.9, MCV 82.2, WBC 5.2, and Plt 322. --RTC in 6 months time for  labs and 12 months for clinic visit to re-evaluate.    #Vitamin B12 deficiency --required Vitamin B12 shots in the past, not currently requiring these --prior normal levels of Vitamin B12/folate, MMA, and homocysteine --continue to monitor    No orders of the defined types were placed in this encounter.  All questions were answered. The patient knows to call the clinic with any problems, questions or concerns.  A total of more than 30 minutes were spent on this encounter and over half of that time was spent on counseling and coordination of care as outlined above.   Ledell Peoples, MD Department of Hematology/Oncology [place] [facility] Phone: [phone] Pager: [phone] Email: [email or web address]  11/16/2021 1:30 PM

## 2022-03-28 IMAGING — MG MM DIGITAL DIAGNOSTIC UNILAT*R* W/ TOMO W/ CAD
4 series · 4 of 12 positions shown · non-contrast
Comparison: Previous exam(s).

CLINICAL DATA: 49-year-old female recalled from screening mammogram
dated 07/21/2020 for a possible right breast asymmetry.

EXAM:
DIGITAL DIAGNOSTIC UNILATERAL RIGHT MAMMOGRAM WITH TOMOSYNTHESIS AND
CAD
TECHNIQUE: Right digital diagnostic mammography and breast tomosynthesis was
performed. The images were evaluated with computer-aided detection.

[R ML synth-2D]
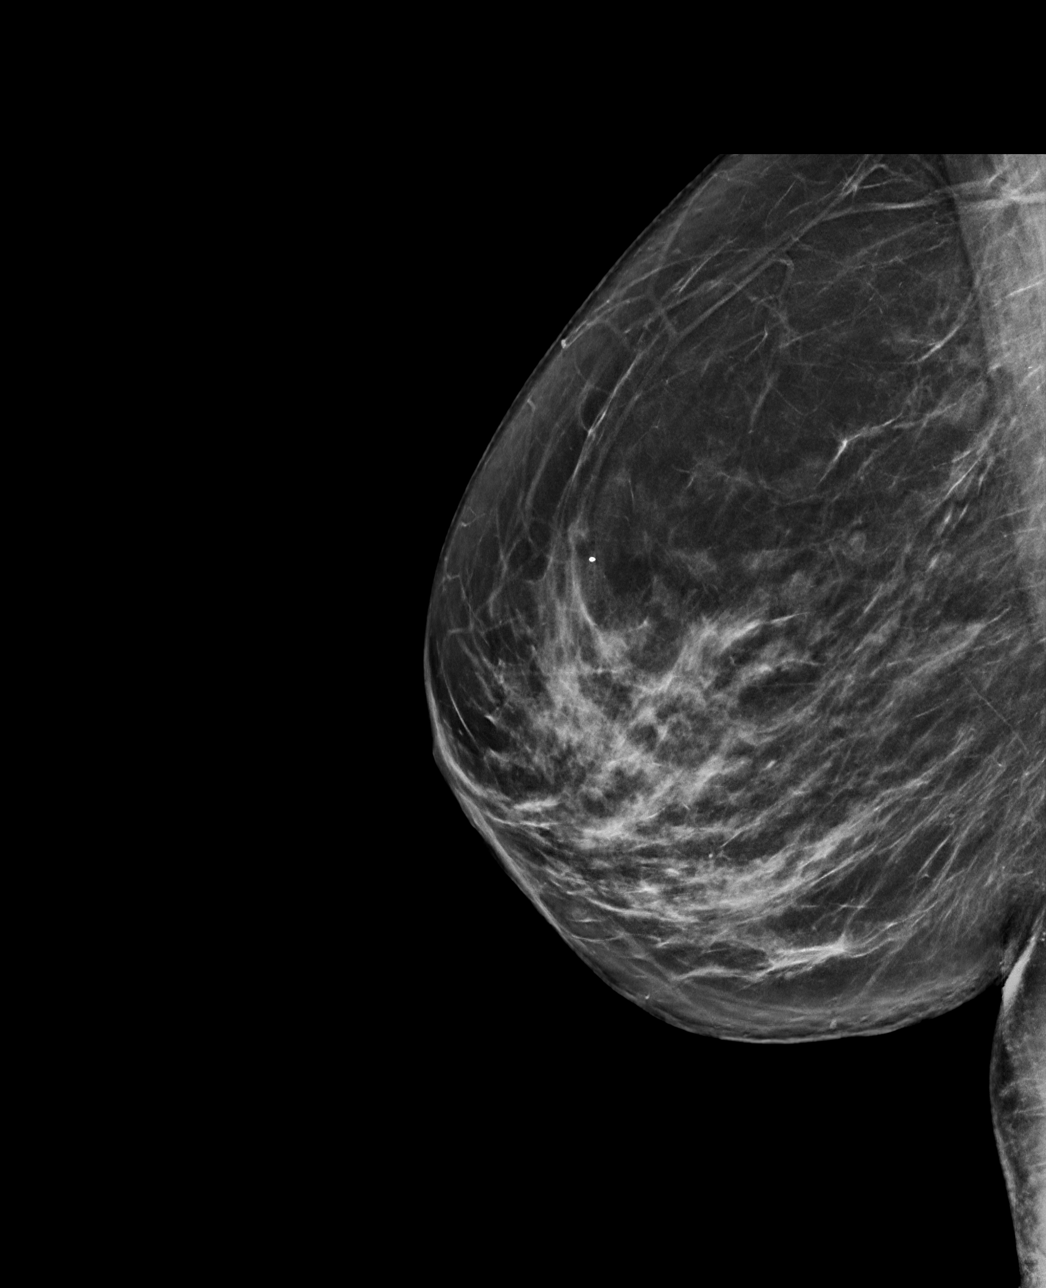

[R MLO synth-2D]
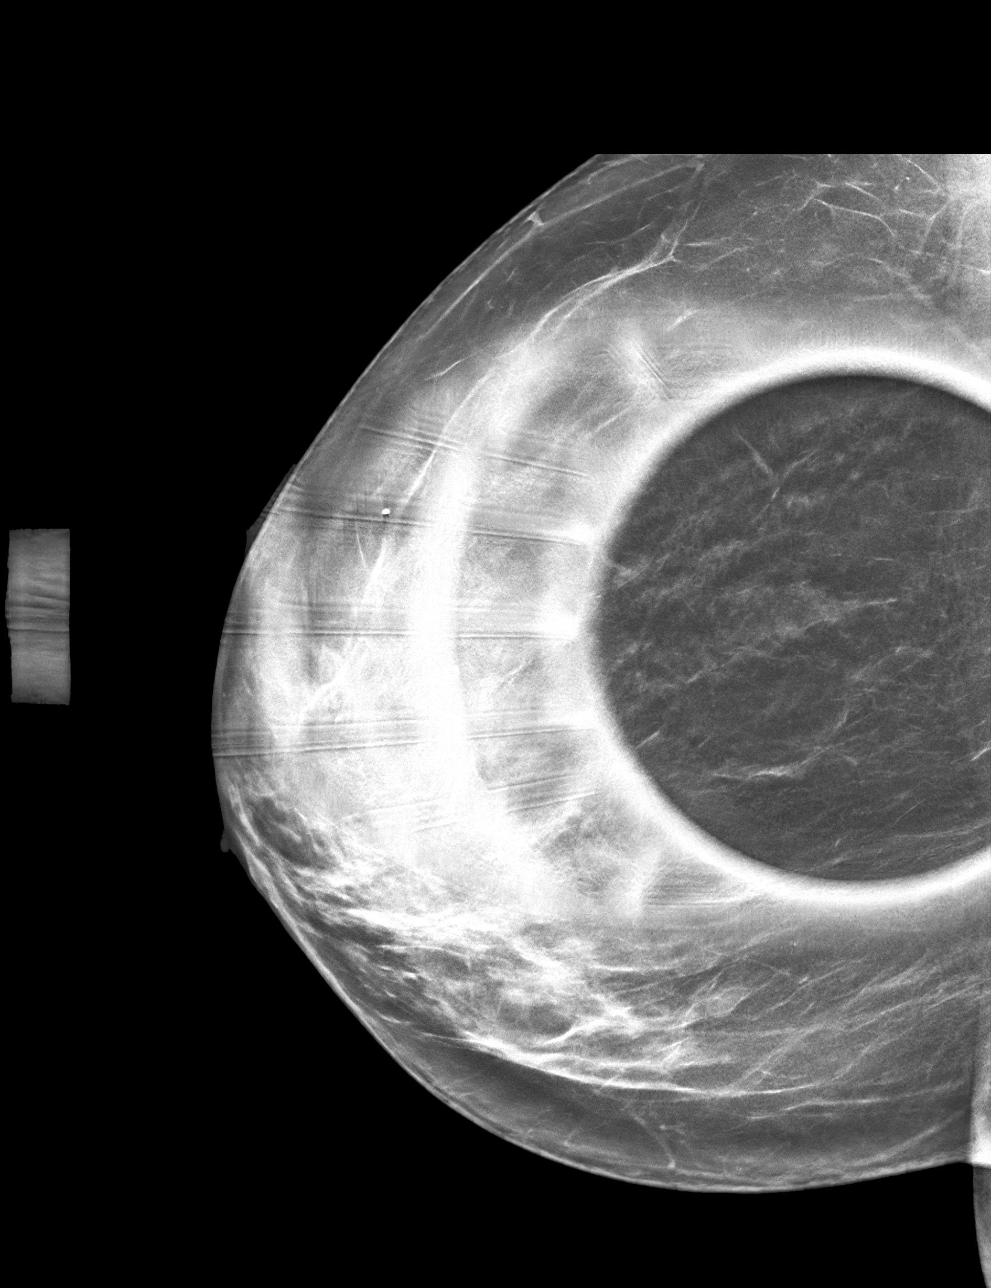

[R ML tomo · tomo slice 39/78.0]
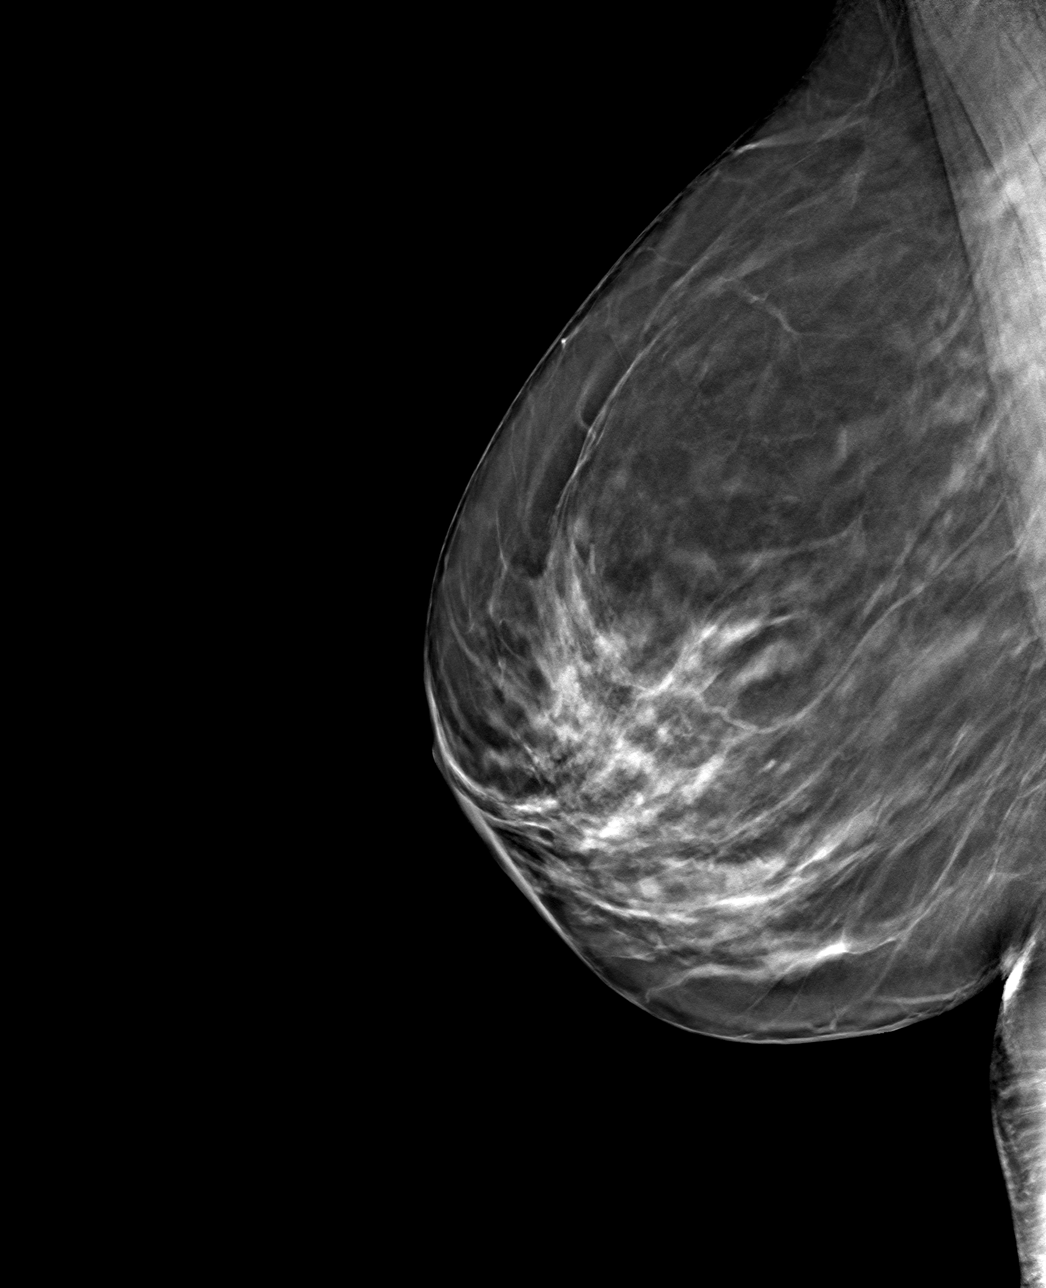

[R MLO tomo · tomo slice 30/59.0]
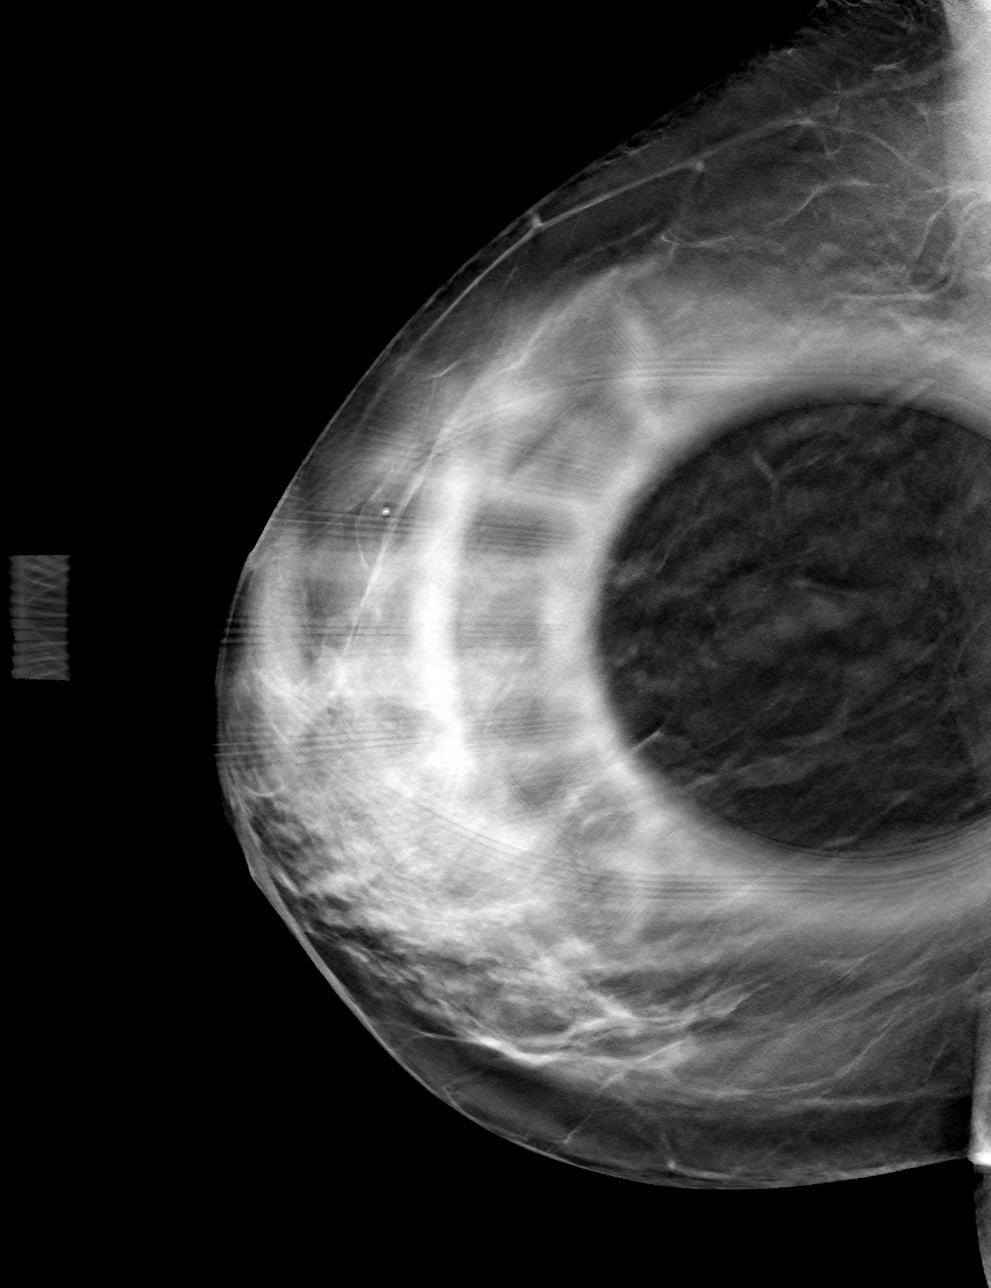

[4 of 12 positions shown; findings below may reference images not displayed]

ACR Breast Density Category c: The breast tissue is heterogeneously
dense, which may obscure small masses.
FINDINGS: Previously described, possible asymmetry in the upper right breast
as seen on the MLO projection only resolves into well dispersed
fibroglandular tissue on today's additional views. No suspicious
findings are identified.
IMPRESSION: No mammographic evidence of malignancy.

RECOMMENDATION:
Screening mammogram in one year.(Code:GD-P-A5G)

I have discussed the findings and recommendations with the patient.
If applicable, a reminder letter will be sent to the patient
regarding the next appointment.

BI-RADS CATEGORY  1: Negative.

## 2022-03-30 ENCOUNTER — Encounter: Payer: Self-pay | Admitting: Hematology and Oncology

## 2022-04-04 ENCOUNTER — Encounter: Payer: Self-pay | Admitting: Hematology and Oncology

## 2022-04-04 ENCOUNTER — Other Ambulatory Visit: Payer: Self-pay

## 2022-04-04 ENCOUNTER — Emergency Department (HOSPITAL_COMMUNITY): Payer: Commercial Managed Care - HMO

## 2022-04-04 ENCOUNTER — Encounter (HOSPITAL_COMMUNITY): Payer: Self-pay

## 2022-04-04 ENCOUNTER — Emergency Department (HOSPITAL_COMMUNITY)
Admission: EM | Admit: 2022-04-04 | Discharge: 2022-04-04 | Disposition: A | Discharge status: Home / Self Care | Payer: Commercial Managed Care - HMO | Attending: Emergency Medicine | Admitting: Emergency Medicine

## 2022-04-04 DIAGNOSIS — X17XXXA Contact with hot engines, machinery and tools, initial encounter: Secondary | ICD-10-CM | POA: Insufficient documentation

## 2022-04-04 DIAGNOSIS — T300 Burn of unspecified body region, unspecified degree: Secondary | ICD-10-CM

## 2022-04-04 DIAGNOSIS — Z23 Encounter for immunization: Secondary | ICD-10-CM | POA: Diagnosis not present

## 2022-04-04 DIAGNOSIS — T2131XA Burn of third degree of chest wall, initial encounter: Secondary | ICD-10-CM | POA: Insufficient documentation

## 2022-04-04 LAB — CBC WITH DIFFERENTIAL/PLATELET
Abs Immature Granulocytes: 0.03 10*3/uL (ref 0.00–0.07)
Basophils Absolute: 0.1 10*3/uL (ref 0.0–0.1)
Basophils Relative: 1 %
Eosinophils Absolute: 0.2 10*3/uL (ref 0.0–0.5)
Eosinophils Relative: 2 %
HCT: 40.9 % (ref 36.0–46.0)
Hemoglobin: 14 g/dL (ref 12.0–15.0)
Immature Granulocytes: 0 %
Lymphocytes Relative: 24 %
Lymphs Abs: 2 10*3/uL (ref 0.7–4.0)
MCH: 28.6 pg (ref 26.0–34.0)
MCHC: 34.2 g/dL (ref 30.0–36.0)
MCV: 83.5 fL (ref 80.0–100.0)
Monocytes Absolute: 0.7 10*3/uL (ref 0.1–1.0)
Monocytes Relative: 8 %
Neutro Abs: 5.4 10*3/uL (ref 1.7–7.7)
Neutrophils Relative %: 65 %
Platelets: 297 10*3/uL (ref 150–400)
RBC: 4.9 MIL/uL (ref 3.87–5.11)
RDW: 14.4 % (ref 11.5–15.5)
WBC: 8.3 10*3/uL (ref 4.0–10.5)
nRBC: 0 % (ref 0.0–0.2)

## 2022-04-04 LAB — COMPREHENSIVE METABOLIC PANEL
ALT: 20 U/L (ref 0–44)
AST: 19 U/L (ref 15–41)
Albumin: 3.9 g/dL (ref 3.5–5.0)
Alkaline Phosphatase: 79 U/L (ref 38–126)
Anion gap: 7 (ref 5–15)
BUN: 15 mg/dL (ref 6–20)
CO2: 27 mmol/L (ref 22–32)
Calcium: 9.3 mg/dL (ref 8.9–10.3)
Chloride: 106 mmol/L (ref 98–111)
Creatinine, Ser: 0.61 mg/dL (ref 0.44–1.00)
GFR, Estimated: >60 mL/min (ref >60)
Glucose, Bld: 98 mg/dL (ref 70–99)
Potassium: 4 mmol/L (ref 3.5–5.1)
Sodium: 140 mmol/L (ref 135–145)
Total Bilirubin: 0.7 mg/dL (ref 0.3–1.2)
Total Protein: 7.8 g/dL (ref 6.5–8.1)

## 2022-04-04 LAB — URINALYSIS, ROUTINE W REFLEX MICROSCOPIC
Bilirubin Urine: NEGATIVE
Glucose, UA: NEGATIVE mg/dL
Hgb urine dipstick: NEGATIVE
Ketones, ur: NEGATIVE mg/dL
Leukocytes,Ua: NEGATIVE
Nitrite: NEGATIVE
Protein, ur: NEGATIVE mg/dL
Specific Gravity, Urine: 1.023 (ref 1.005–1.030)
pH: 7 (ref 5.0–8.0)

## 2022-04-04 LAB — I-STAT BETA HCG BLOOD, ED (MC, WL, AP ONLY): I-stat hCG, quantitative: <5 m[IU]/mL (ref <5)

## 2022-04-04 MED ORDER — OXYCODONE-ACETAMINOPHEN 5-325 MG PO TABS
1.0000 | ORAL_TABLET | Freq: Once | ORAL | Status: AC
  Administered 2022-04-04: 1 via ORAL
  Filled 2022-04-04: qty 1

## 2022-04-04 MED ORDER — SILVER SULFADIAZINE 1 % EX CREA
TOPICAL_CREAM | Freq: Once | CUTANEOUS | Status: AC
  Filled 2022-04-04: qty 50

## 2022-04-04 MED ORDER — TETANUS-DIPHTH-ACELL PERTUSSIS 5-2.5-18.5 LF-MCG/0.5 IM SUSY
0.5000 mL | PREFILLED_SYRINGE | Freq: Once | INTRAMUSCULAR | Status: AC
  Administered 2022-04-04: 0.5 mL via INTRAMUSCULAR
  Filled 2022-04-04: qty 0.5

## 2022-04-04 MED ORDER — SILVER SULFADIAZINE 1 % EX CREA: 1.0000 | TOPICAL_CREAM | Freq: Every day | CUTANEOUS | 0 refills | Status: AC
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
## 2022-04-05 ENCOUNTER — Telehealth: Payer: Self-pay | Admitting: *Deleted

## 2022-04-05 NOTE — Telephone Encounter (Signed)
Transition Care Management Follow-up Telephone Call Date of discharge and from where: 04/04/2022 @ [facility] ED How have you been since you were released from the hospital? Patient states she is doing ok  Any questions or concerns? No  Items Reviewed: Did the pt receive and understand the discharge instructions provided? Yes  Medications obtained and verified? Yes  Other? N/a Any new allergies since your discharge? No  Dietary orders reviewed? No Do you have support at home? Yes   Home Care and Equipment/Supplies: Were home health services ordered? no If so, what is the name of the agency? N/a  Has the agency set up a time to come to the patient's home? not applicable Were any new equipment or medical supplies ordered?  No What is the name of the medical supply agency? N/a Were you able to get the supplies/equipment? not applicable Do you have any questions related to the use of the equipment or supplies? No  Functional Questionnaire: (I = Independent and D = Dependent) ADLs: I  Bathing/Dressing- I  Meal Prep- I  Eating- I  Maintaining continence- I  Transferring/Ambulation- I  Managing Meds- I  Follow up appointments reviewed:  PCP Hospital f/u appt confirmed?  Patient states she will call back to schedule an appt with provider.  [facility] f/u appt confirmed? No  Scheduled to see n/a on n/a @ n/a. Are transportation arrangements needed? No  If their condition worsens, is the pt aware to call PCP or go to the Emergency Dept.? Yes Was the patient provided with contact information for the PCP's office or ED? Yes Was to pt encouraged to call back with questions or concerns? Yes

## 2022-05-17 ENCOUNTER — Other Ambulatory Visit: Payer: Self-pay | Admitting: Hematology and Oncology

## 2022-05-17 ENCOUNTER — Inpatient Hospital Stay: Payer: Commercial Managed Care - HMO | Attending: Emergency Medicine

## 2022-05-17 DIAGNOSIS — D500 Iron deficiency anemia secondary to blood loss (chronic): Secondary | ICD-10-CM

## 2022-11-12 ENCOUNTER — Encounter: Payer: Self-pay | Admitting: Hematology and Oncology

## 2022-11-16 ENCOUNTER — Encounter: Payer: Self-pay | Admitting: Hematology and Oncology

## 2022-11-18 NOTE — Progress Notes (Unsigned)
No show

## 2022-11-19 ENCOUNTER — Inpatient Hospital Stay: Payer: 59 | Admitting: Hematology and Oncology

## 2022-11-19 ENCOUNTER — Inpatient Hospital Stay: Payer: 59 | Attending: Emergency Medicine

## 2022-11-19 DIAGNOSIS — D500 Iron deficiency anemia secondary to blood loss (chronic): Secondary | ICD-10-CM

## 2022-11-20 ENCOUNTER — Encounter: Payer: Self-pay | Admitting: Hematology and Oncology

## 2022-11-26 ENCOUNTER — Telehealth: Payer: Self-pay | Admitting: Hematology and Oncology

## 2022-12-07 ENCOUNTER — Encounter: Payer: Self-pay | Admitting: Hematology and Oncology

## 2022-12-14 ENCOUNTER — Inpatient Hospital Stay: Payer: 59 | Admitting: Physician Assistant

## 2022-12-14 ENCOUNTER — Inpatient Hospital Stay: Payer: 59

## 2023-03-28 IMAGING — US US ABDOMEN LIMITED
1 series · 13 of 25 positions shown · non-contrast
Comparison: None.

CLINICAL DATA: Epigastric pain

EXAM:
ULTRASOUND ABDOMEN LIMITED RIGHT UPPER QUADRANT

[Series 1: us abdomen limited · 13 of 47 slices shown]
[im 1/47]
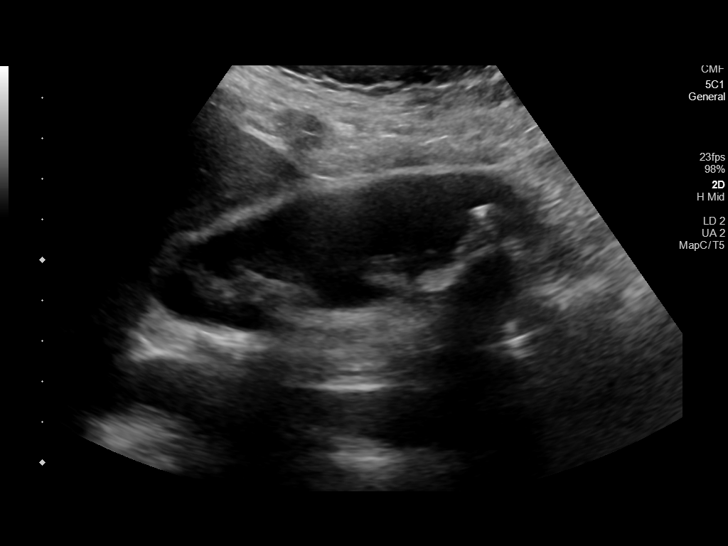
[im 4/47]
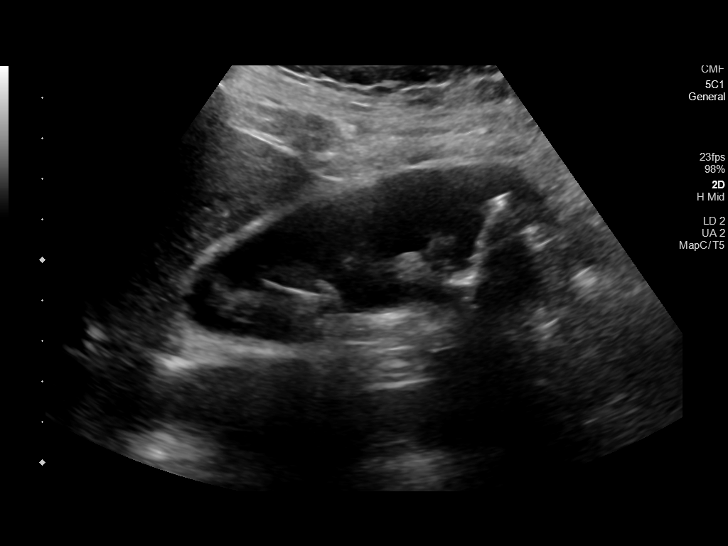
[im 8/47]
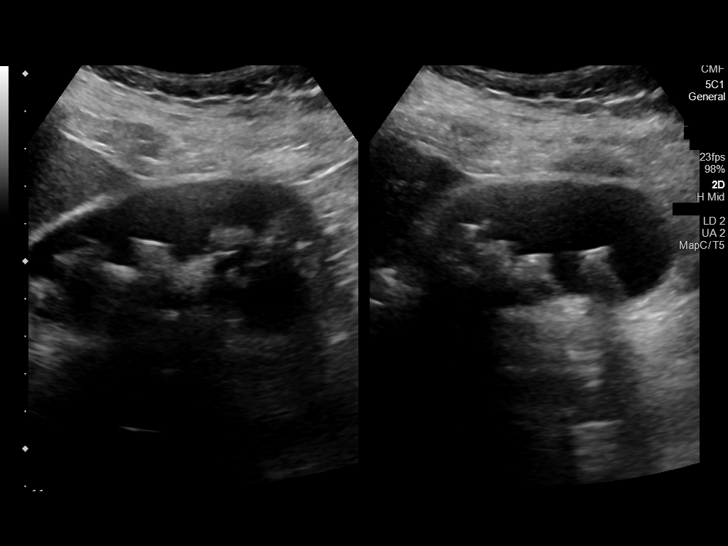
[im 12/47]
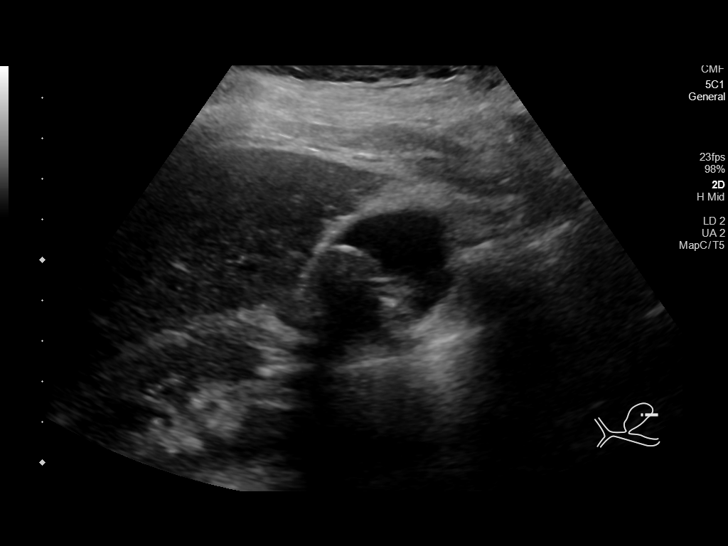
[im 16/47]
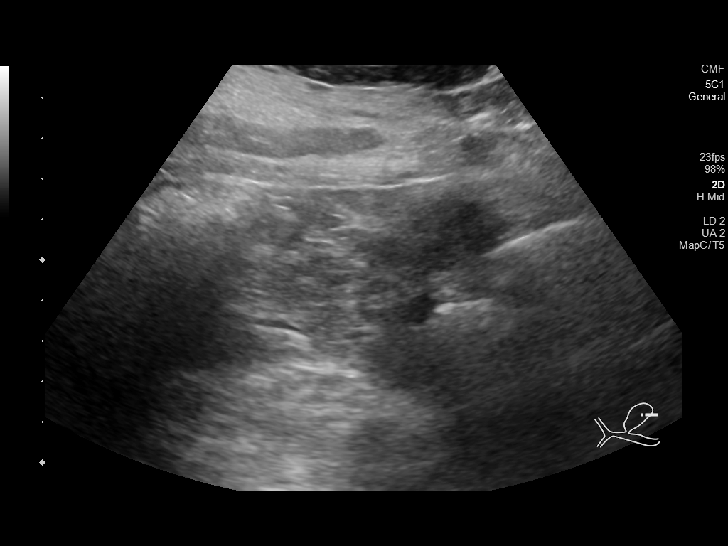
[im 20/47]
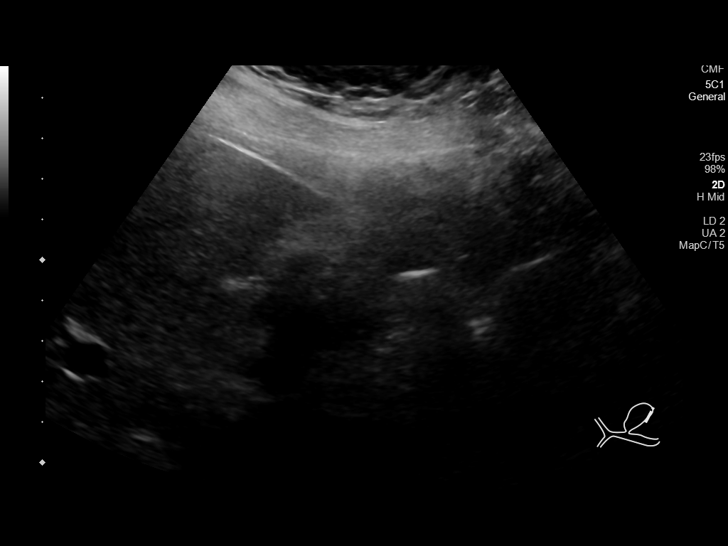
[im 24/47]
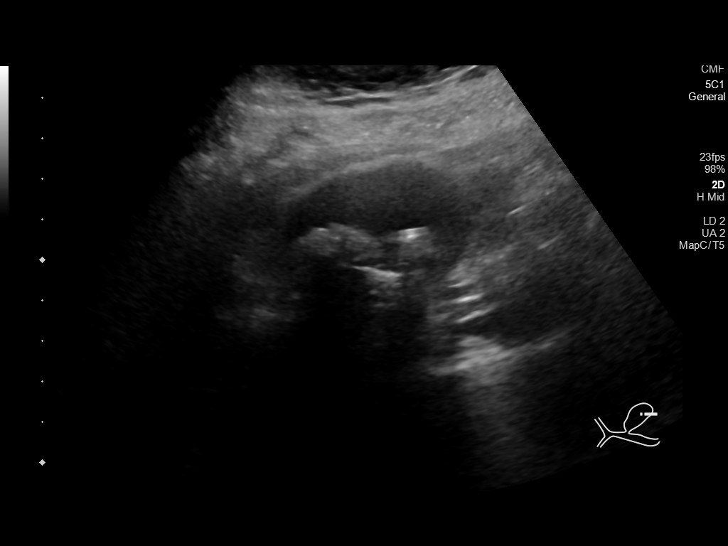
[im 27/47]
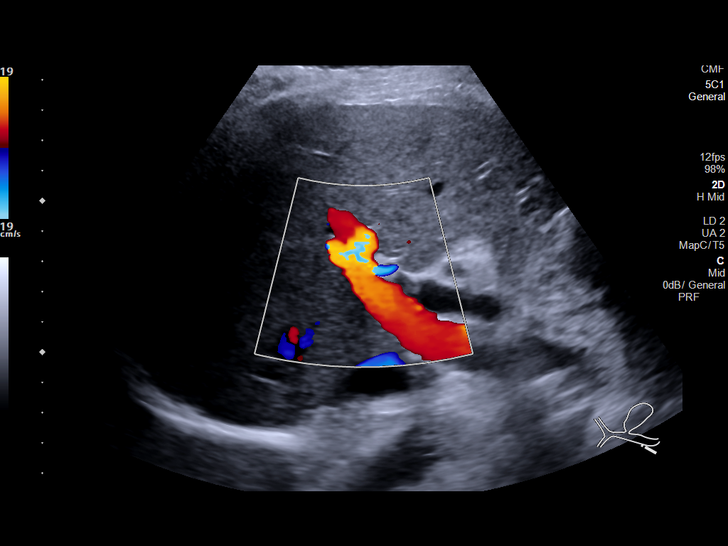
[im 31/47]
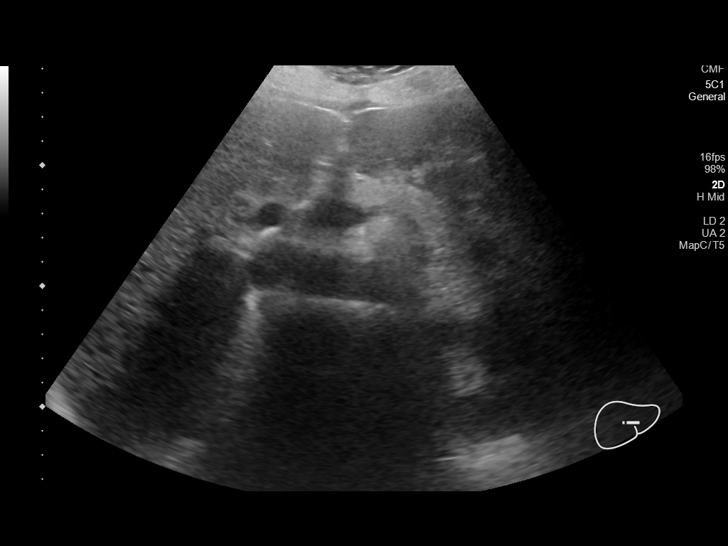
[im 35/47]
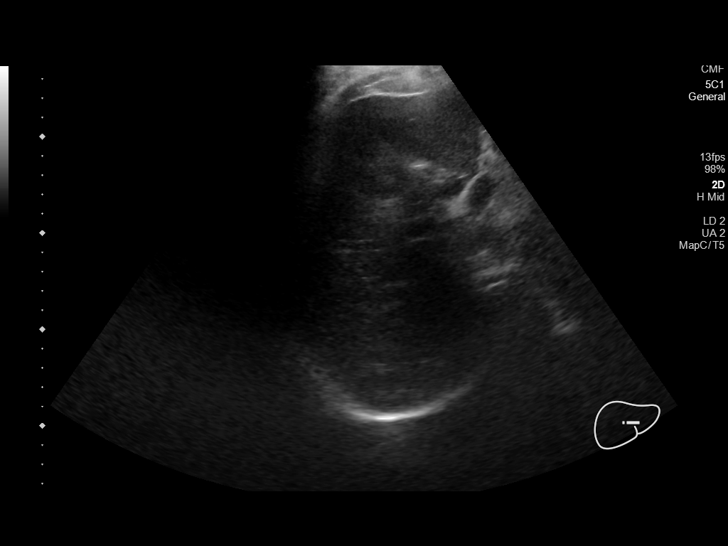
[im 39/47]
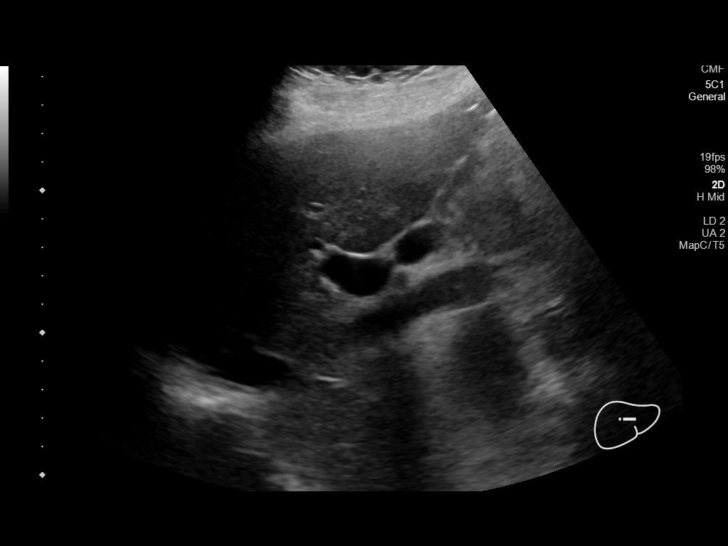
[im 43/47]
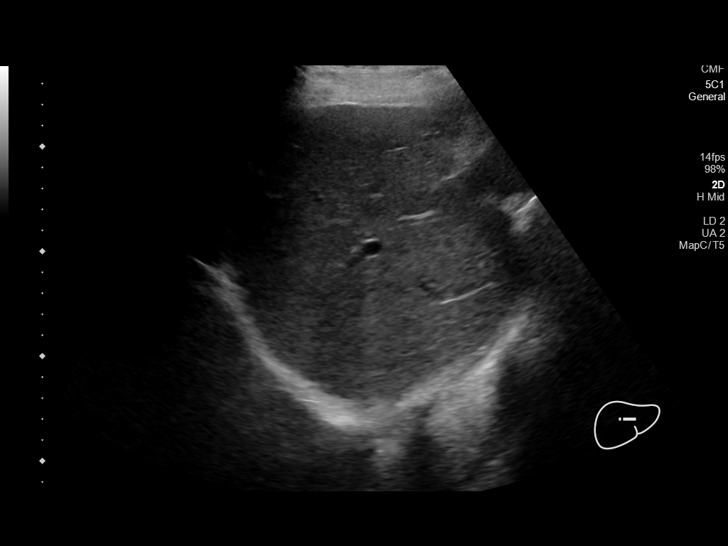
[im 47/47]
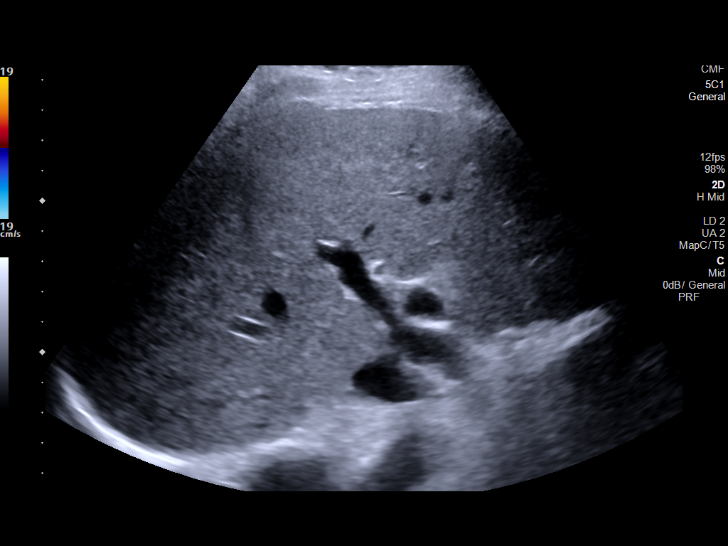

[13 of 25 positions shown; findings below may reference images not displayed]

FINDINGS: Gallbladder:

There are multiple gallbladder stones. There is mild wall thickening
measuring 3 mm. There is no fluid around the gallbladder.
Technologist observed tenderness over the gallbladder.

Common bile duct:

Diameter: 10 mm. There is no dilation of intrahepatic bile ducts.
Distal common bile duct is obscured by bowel gas.

Liver:

No focal lesion identified. Within normal limits in parenchymal
echogenicity. Portal vein is patent on color Doppler imaging with
normal direction of blood flow towards the liver.

Other: None.
IMPRESSION: Gallbladder stones. There is wall thickening in gallbladder which
may be due to acute or chronic cholecystitis. Technologist observed
tenderness over the gallbladder. There are no definite imaging signs
of acute cholecystitis. Please correlate with clinical symptoms and
laboratory findings.

There is prominence of proximal common bile duct measuring 10 mm
without intrahepatic duct dilation. Distal common bile duct is
obscured by bowel gas. Please correlate with clinical symptoms
laboratory findings for evidence of distal bile duct obstruction.
